# Patient Record
Sex: Female | Born: 1960 | Race: Black or African American | Hispanic: No | Marital: Married | Smoking: Never smoker
Health system: Southern US, Community
[De-identification: ages and names within clinical notes are randomized; demographics above are authoritative.]

## PROBLEM LIST (undated history)

## (undated) DIAGNOSIS — K219 Gastro-esophageal reflux disease without esophagitis: Secondary | ICD-10-CM

## (undated) DIAGNOSIS — I10 Essential (primary) hypertension: Secondary | ICD-10-CM

## (undated) DIAGNOSIS — K589 Irritable bowel syndrome without diarrhea: Secondary | ICD-10-CM

## (undated) HISTORY — PX: CHOLECYSTECTOMY: SHX55

## (undated) HISTORY — DX: Gastro-esophageal reflux disease without esophagitis: K21.9

## (undated) HISTORY — PX: PARTIAL HYSTERECTOMY: SHX80

## (undated) HISTORY — PX: ABDOMINAL HYSTERECTOMY: SHX81

## (undated) HISTORY — DX: Irritable bowel syndrome without diarrhea: K58.9

## (undated) HISTORY — PX: ROTATOR CUFF REPAIR: SHX139

## (undated) HISTORY — DX: Essential (primary) hypertension: I10

---

## 2002-02-05 ENCOUNTER — Other Ambulatory Visit: Admission: RE | Admit: 2002-02-05 | Discharge: 2002-02-05 | Payer: Self-pay | Admitting: Obstetrics and Gynecology

## 2002-08-31 ENCOUNTER — Inpatient Hospital Stay (HOSPITAL_COMMUNITY): Admission: RE | Admit: 2002-08-31 | Discharge: 2002-09-02 | Payer: Self-pay | Admitting: Obstetrics and Gynecology

## 2003-04-01 ENCOUNTER — Other Ambulatory Visit: Admission: RE | Admit: 2003-04-01 | Discharge: 2003-04-01 | Payer: Self-pay | Admitting: Obstetrics and Gynecology

## 2004-05-26 ENCOUNTER — Other Ambulatory Visit: Admission: RE | Admit: 2004-05-26 | Discharge: 2004-05-26 | Payer: Self-pay | Admitting: Obstetrics and Gynecology

## 2005-05-31 ENCOUNTER — Other Ambulatory Visit: Admission: RE | Admit: 2005-05-31 | Discharge: 2005-05-31 | Payer: Self-pay | Admitting: Obstetrics and Gynecology

## 2010-11-13 ENCOUNTER — Other Ambulatory Visit: Payer: Self-pay | Admitting: Obstetrics and Gynecology

## 2010-11-13 DIAGNOSIS — R928 Other abnormal and inconclusive findings on diagnostic imaging of breast: Secondary | ICD-10-CM

## 2010-11-20 ENCOUNTER — Ambulatory Visit
Admission: RE | Admit: 2010-11-20 | Discharge: 2010-11-20 | Disposition: A | Discharge status: Home / Self Care | Payer: Self-pay | Source: Ambulatory Visit | Attending: Obstetrics and Gynecology | Admitting: Obstetrics and Gynecology

## 2010-11-20 DIAGNOSIS — R928 Other abnormal and inconclusive findings on diagnostic imaging of breast: Secondary | ICD-10-CM

## 2012-11-19 ENCOUNTER — Other Ambulatory Visit: Payer: Self-pay | Admitting: Obstetrics and Gynecology

## 2012-11-19 DIAGNOSIS — R928 Other abnormal and inconclusive findings on diagnostic imaging of breast: Secondary | ICD-10-CM

## 2012-12-03 ENCOUNTER — Ambulatory Visit
Admission: RE | Admit: 2012-12-03 | Discharge: 2012-12-03 | Disposition: A | Discharge status: Home / Self Care | Payer: BC Managed Care – PPO | Source: Ambulatory Visit | Attending: Obstetrics and Gynecology | Admitting: Obstetrics and Gynecology

## 2012-12-03 DIAGNOSIS — R928 Other abnormal and inconclusive findings on diagnostic imaging of breast: Secondary | ICD-10-CM

## 2015-01-12 ENCOUNTER — Other Ambulatory Visit: Payer: Self-pay | Admitting: Obstetrics and Gynecology

## 2015-01-13 LAB — CYTOLOGY - PAP

## 2017-01-30 ENCOUNTER — Other Ambulatory Visit: Payer: Self-pay | Admitting: Obstetrics and Gynecology

## 2017-01-30 DIAGNOSIS — R928 Other abnormal and inconclusive findings on diagnostic imaging of breast: Secondary | ICD-10-CM

## 2017-02-04 ENCOUNTER — Ambulatory Visit
Admission: RE | Admit: 2017-02-04 | Discharge: 2017-02-04 | Disposition: A | Discharge status: Home / Self Care | Payer: BLUE CROSS/BLUE SHIELD | Source: Ambulatory Visit | Attending: Obstetrics and Gynecology | Admitting: Obstetrics and Gynecology

## 2017-02-04 DIAGNOSIS — R928 Other abnormal and inconclusive findings on diagnostic imaging of breast: Secondary | ICD-10-CM

## 2017-08-02 IMAGING — MG 2D DIGITAL DIAGNOSTIC UNILATERAL LEFT MAMMOGRAM WITH CAD AND ADJ
6 series · 6 of 14 positions shown · non-contrast
Comparison: Previous exam(s).

CLINICAL DATA: Screening recall for a possible asymmetry in the
left breast seen on the CC view only.

EXAM:
2D DIGITAL DIAGNOSTIC UNILATERAL LEFT MAMMOGRAM WITH CAD AND ADJUNCT
TOMO

[L MLO]
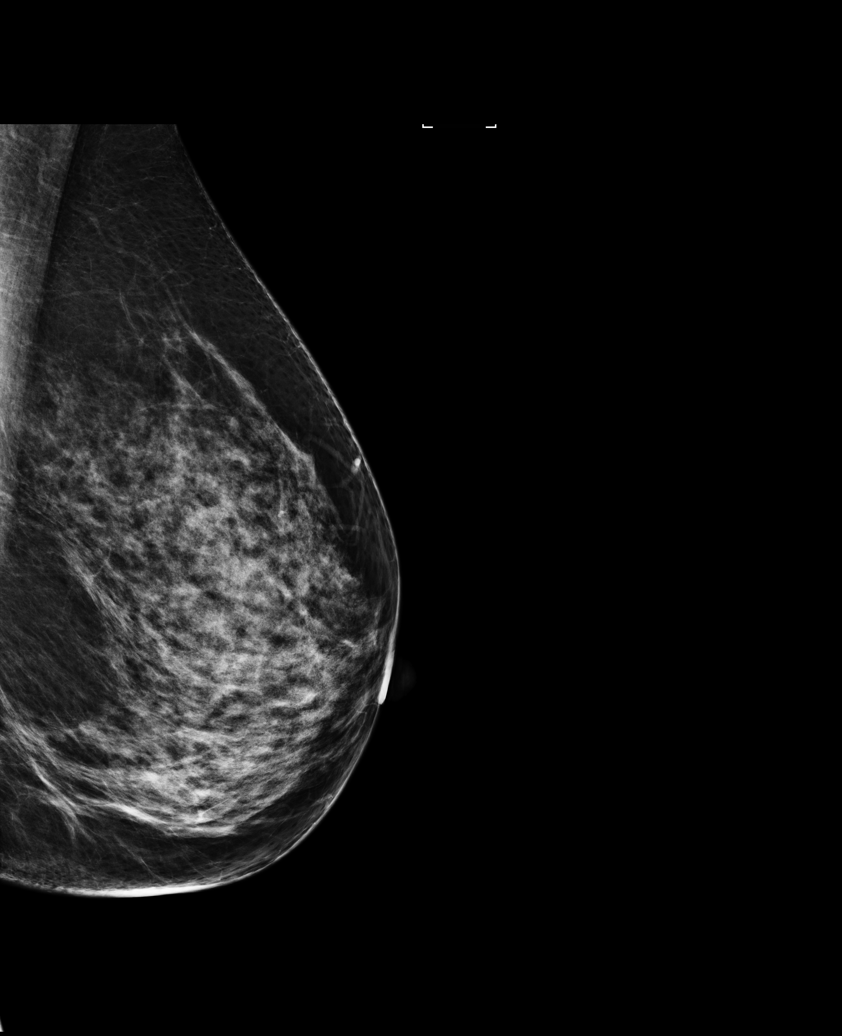

[L CC]
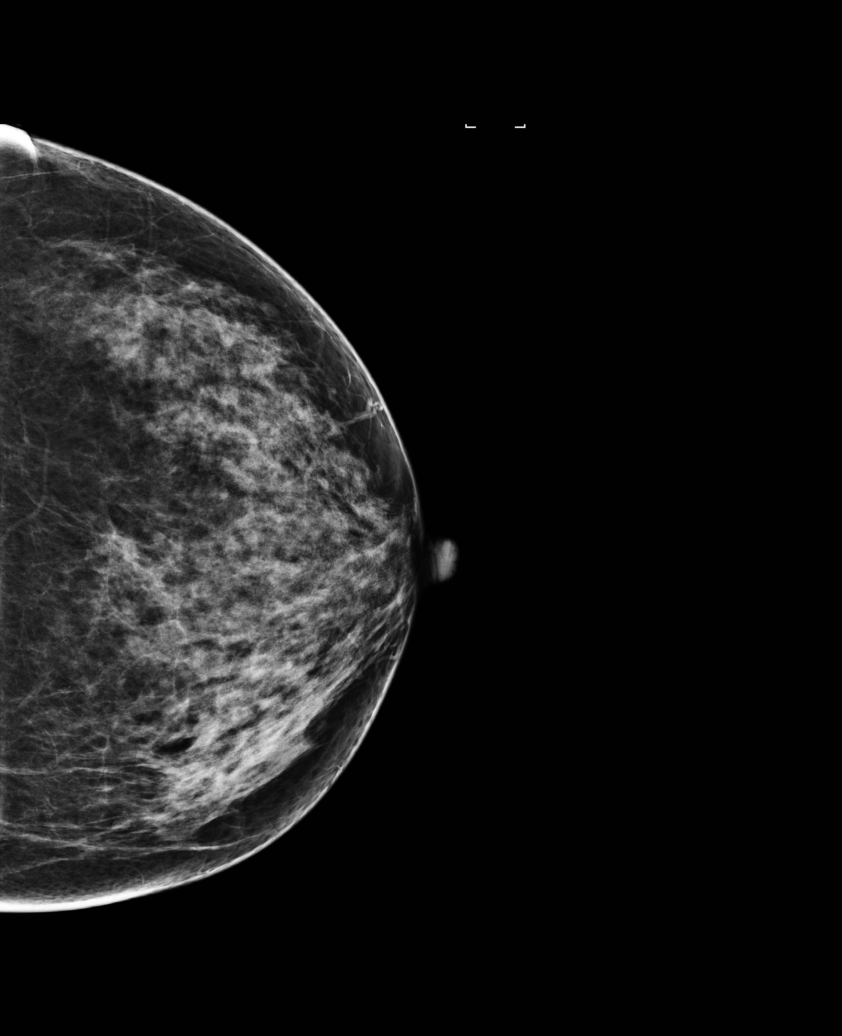

[L MLO synth-2D]
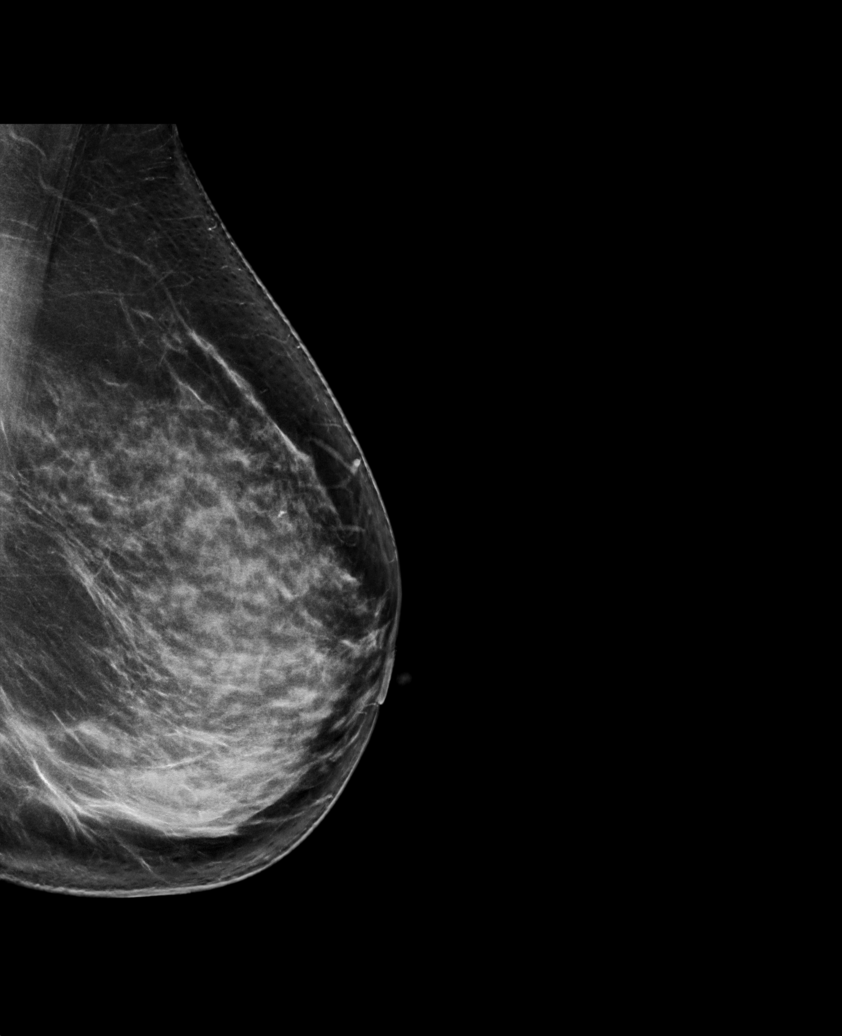

[L CC synth-2D]
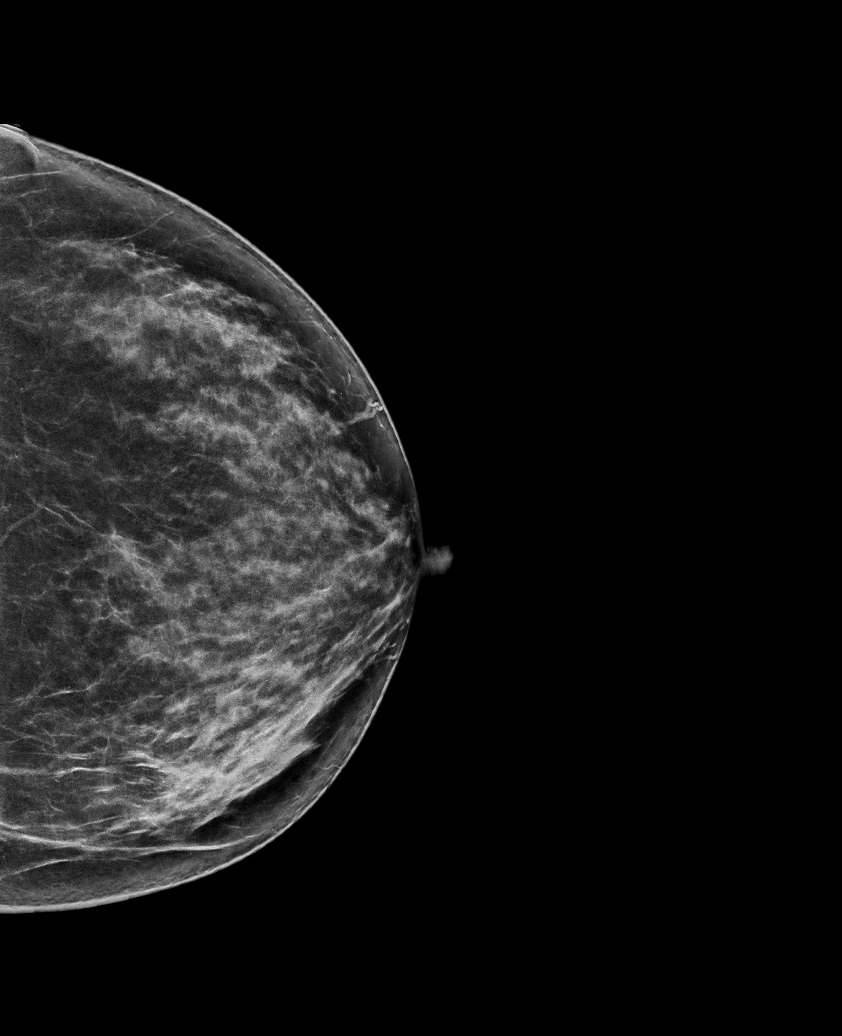

[L MLO tomo · tomo slice 43/84.0]
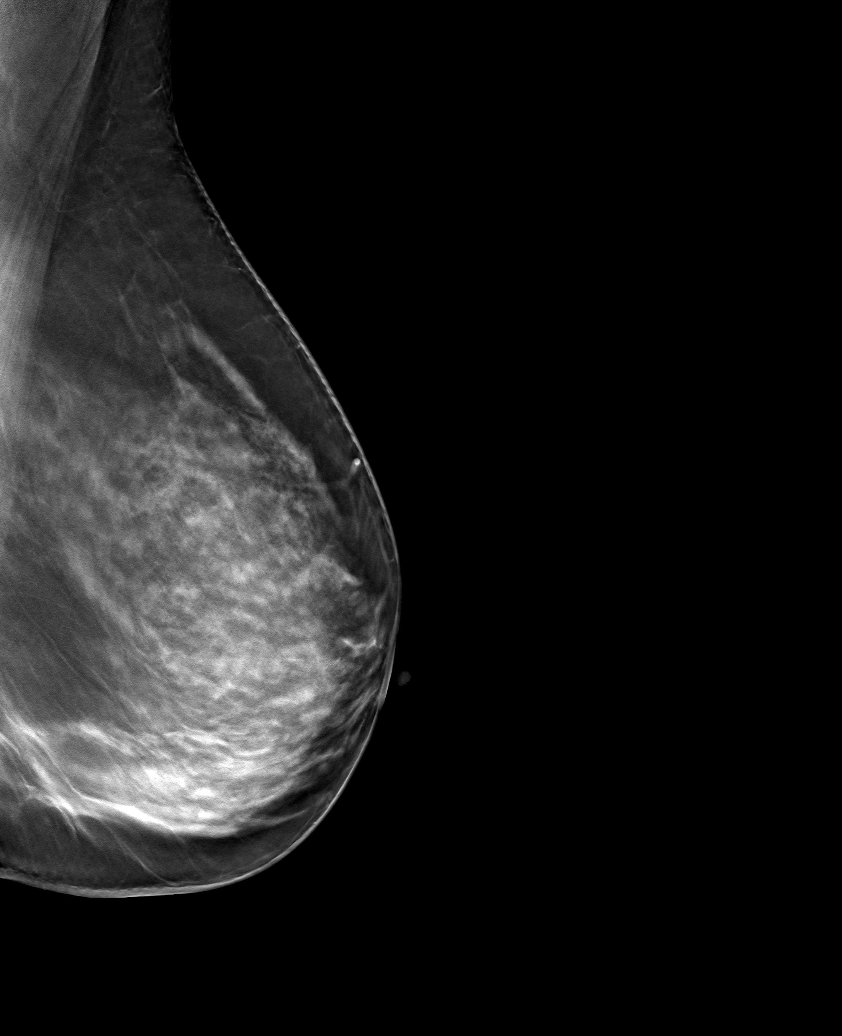

[L CC tomo · tomo slice 37/74.0]
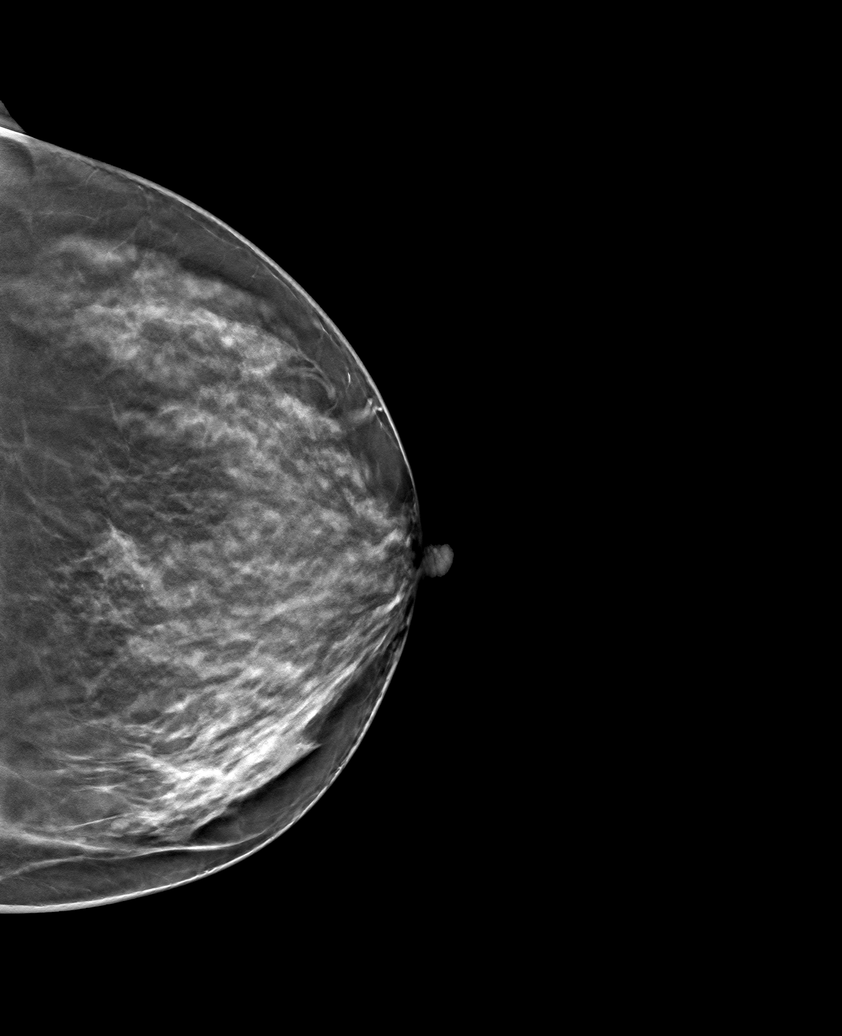

[6 of 14 positions shown; findings below may reference images not displayed]

ACR Breast Density Category c: The breast tissue is heterogeneously
dense, which may obscure small masses.
FINDINGS: On the diagnostic 2D and 3D images, the possible asymmetry noted
laterally on the screening CC view is not reproduced. There are no
areas of significant asymmetry. There are no discrete masses or
areas of architectural distortion. There are no suspicious
calcifications.

Mammographic images were processed with CAD.
IMPRESSION: 1. No evidence of malignancy. The possible asymmetry noted on the
screening study was due to superimposed fibroglandular tissue.

RECOMMENDATION:
Screening mammogram in one year.(Code:QS-I-NT2)

I have discussed the findings and recommendations with the patient.
Results were also provided in writing at the conclusion of the
visit. If applicable, a reminder letter will be sent to the patient
regarding the next appointment.

BI-RADS CATEGORY  1: Negative.

## 2021-02-23 ENCOUNTER — Encounter: Payer: Self-pay | Admitting: Nurse Practitioner

## 2021-03-24 ENCOUNTER — Ambulatory Visit (INDEPENDENT_AMBULATORY_CARE_PROVIDER_SITE_OTHER): Payer: BC Managed Care – PPO | Admitting: Nurse Practitioner

## 2021-03-24 ENCOUNTER — Other Ambulatory Visit: Payer: Self-pay

## 2021-03-24 ENCOUNTER — Encounter: Payer: Self-pay | Admitting: Nurse Practitioner

## 2021-03-24 VITALS — BP 120/80 | HR 93 | Ht 63.0 in | Wt 155.0 lb

## 2021-03-24 DIAGNOSIS — R1032 Left lower quadrant pain: Secondary | ICD-10-CM | POA: Diagnosis not present

## 2021-03-24 DIAGNOSIS — K5909 Other constipation: Secondary | ICD-10-CM | POA: Diagnosis not present

## 2021-03-24 DIAGNOSIS — K219 Gastro-esophageal reflux disease without esophagitis: Secondary | ICD-10-CM | POA: Diagnosis not present

## 2021-03-24 MED ORDER — DICYCLOMINE HCL 10 MG PO CAPS: 10.0000 mg | ORAL_CAPSULE | Freq: Two times a day (BID) | ORAL | 0 refills | Status: DC | PRN

## 2021-03-24 NOTE — Patient Instructions (Addendum)
If you are age 61 or younger, your body mass index should be between 19-25. Your Body mass index is 27.46 kg/m. If this is out of the aformentioned range listed, please consider follow up with your Primary Care Provider.   __________________________________________________________  The  GI providers would like to encourage you to use [facility] to communicate with providers for non-urgent requests or questions.  Due to long hold times on the telephone, sending your provider a message by [facility] may be a faster and more efficient way to get a response.  Please allow 48 business hours for a response.  Please remember that this is for non-urgent requests.   RESUME: Motegrity  We have sent the following medications to your pharmacy for you to pick up at your convenience:  START: Bentyl 10mg  one tablet twice daily as needed for stomach cramping.  We will attempt to obtain records from Dr .  You are scheduled to follow up with our office on 04-26-2021 at 11:30am.  Thank you for entrusting me with your care and choosing [facility].  [facility], NP

## 2021-03-24 NOTE — Progress Notes (Signed)
ASSESSMENT AND PLAN    # 60 year old female who is self-referred for evaluation of crampy intermittent LLQ pain over the last 2 years.  Apparently GYN evaluations have been negative as has a colonoscopy in [facility] . --Request records from [facility] including colonoscopy and upper endoscopies. --Patient will return to see me in approximately 3 weeks, I should have the records by then.  In the interim I would like to make sure that constipation is not a factor in her pain as she does give a history of chronic constipation.  I asked her to resume daily Motegrity (she has some at home) --For abdominal cramping she will try Bentyl 10 mg twice daily.  Hopefully Motegrity will prevent her from getting constipated on Bentyl  # GERD, ? History of esophageal strictures. Currently asymptomatic on daily Dexilant --Await EGD reports  HISTORY OF PRESENT ILLNESS     Chief Complaint : LLQ pain  Valerie Nunez is a 60 y.o. female with a past medical history significant for IBS, hypertension, asthma, arthritis, GERD and cholecystectomy.   Patient is new to the practice, self-referred for evaluation of abdominal pain.  She has been to the ED twice since June for evaluation of LLQ pain.  She has an appointment to establish care with Dr. Karilyn Cota but it isn't until November and she wanted to be seen sooner.  Patient gives a history of intermittent LLQ pain for 2 years.  She reports a negative work-up by gynecology.  She was seen in the ED for LLQ pain 02/11/2021.  UA was normal.  Her CBC, CMP were normal.  Lipase minimally elevated at 64.  No imaging was done in the ED  Patient says that she has had an EGD and colonoscopy in [place] within the last couple of years.  She is frustrated that there has been no etiology found for her ongoing LLQ pain.  She describes this pain as being crampy in nature.  She has episodes about 2-3 times a week and sometimes the pain lasts all day.  She  cannot relate the pain to eating and it does not improve with bowel movements.  She has a history of chronic constipation.  In the past she has taken Linzess and Motegrity but stopped both of them because she thought it exacerbated her LLQ pain.  She has been trying to manage her constipation with diet and does feel like things have improved  Yuridia gives a history of GERD.  She describes 2 esophageal dilations by a surgeon in [place] within the last few years.  She takes Dexilant.  Currently no GERD symptoms nor dysphagia    Past Medical History:  Diagnosis Date   GERD (gastroesophageal reflux disease)    Hypertension    IBS (irritable bowel syndrome)      Past Surgical History:  Procedure Laterality Date   CHOLECYSTECTOMY     PARTIAL HYSTERECTOMY     ROTATOR CUFF REPAIR     History reviewed. No pertinent family history.   Current Outpatient Medications  Medication Sig Dispense Refill   amLODipine (NORVASC) 5 MG tablet Take 5 mg by mouth daily.     dexlansoprazole (DEXILANT) 60 MG capsule Dexilant 60 mg capsule, delayed release     dicyclomine (BENTYL) 10 MG capsule Take 1 capsule (10 mg total) by mouth 2 (two) times daily as needed for spasms. 60 capsule 0   No current facility-administered medications for this visit.   No Known Allergies   Review  of Systems: Positive for allergy, sinus trouble, anxiety, arthritis, occasional back pain, muscle pain and cramps, sore throat and voice changes.  All other systems reviewed and negative except where noted in HPI.    PHYSICAL EXAM :    Wt Readings from Last 3 Encounters:  No data found for Wt    BP 120/80   Pulse 93   Ht 5\' 3"  (1.6 m)   Wt 155 lb (70.3 kg)   BMI 27.46 kg/m  Constitutional:  Pleasant female in no acute distress. Psychiatric: Normal mood and affect. Behavior is normal. EENT: Pupils normal.  Conjunctivae are normal. No scleral icterus. Neck supple.  Cardiovascular: Normal rate, regular rhythm. No  edema Pulmonary/chest: Effort normal and breath sounds normal. No wheezing, rales or rhonchi. Abdominal: Soft, nondistended, nontender. Bowel sounds active throughout. There are no masses palpable. No hepatomegaly. Neurological: Alert and oriented to person place and time. Skin: Skin is warm and dry. No rashes noted.  , NP  03/24/2021, 2:11 PM

## 2021-03-27 NOTE — Progress Notes (Signed)
____________________________________________________________  Attending physician addendum:  Thank you for sending this case to me. I have reviewed the entire note and agree with the plan.  When records available for review, please be sure to discover if  CT scan of the abdomen has been performed as part of this workup.  Amada Jupiter, MD  ____________________________________________________________

## 2021-04-17 ENCOUNTER — Other Ambulatory Visit: Payer: Self-pay | Admitting: Nurse Practitioner

## 2021-04-26 ENCOUNTER — Other Ambulatory Visit (INDEPENDENT_AMBULATORY_CARE_PROVIDER_SITE_OTHER): Payer: BC Managed Care – PPO

## 2021-04-26 ENCOUNTER — Encounter: Payer: Self-pay | Admitting: Nurse Practitioner

## 2021-04-26 ENCOUNTER — Ambulatory Visit (INDEPENDENT_AMBULATORY_CARE_PROVIDER_SITE_OTHER): Payer: BC Managed Care – PPO | Admitting: Nurse Practitioner

## 2021-04-26 ENCOUNTER — Telehealth: Payer: Self-pay | Admitting: Nurse Practitioner

## 2021-04-26 VITALS — BP 110/78 | HR 80 | Ht 63.0 in | Wt 159.0 lb

## 2021-04-26 DIAGNOSIS — D649 Anemia, unspecified: Secondary | ICD-10-CM | POA: Diagnosis not present

## 2021-04-26 DIAGNOSIS — R109 Unspecified abdominal pain: Secondary | ICD-10-CM

## 2021-04-26 DIAGNOSIS — D508 Other iron deficiency anemias: Secondary | ICD-10-CM | POA: Diagnosis not present

## 2021-04-26 DIAGNOSIS — K219 Gastro-esophageal reflux disease without esophagitis: Secondary | ICD-10-CM

## 2021-04-26 LAB — CBC
HCT: 39.8 % (ref 36.0–46.0)
Hemoglobin: 13.3 g/dL (ref 12.0–15.0)
MCHC: 33.3 g/dL (ref 30.0–36.0)
MCV: 100.3 fl — ABNORMAL HIGH (ref 78.0–100.0)
Platelets: 245 10*3/uL (ref 150.0–400.0)
RBC: 3.97 Mil/uL (ref 3.87–5.11)
RDW: 12.9 % (ref 11.5–15.5)
WBC: 5 10*3/uL (ref 4.0–10.5)

## 2021-04-26 NOTE — Telephone Encounter (Signed)
I am so sorry. I do not understand the message. Does she need a call back? Or is she sharing information?

## 2021-04-26 NOTE — Progress Notes (Signed)
ASSESSMENT AND PLAN     # 60 yo female with chronic intermittent left mid / LLQ pain unrelated to physical activity or bowel movements. Pain possibly musculoskeletal . Pain could also be secondary to constipation as she feels better having regular bowel movements on high fiber diet and Motegrity. She reports a negative pelvic US by her GYN. Records from GI in [place] were reviewed today. No findings on colonoscopy in March 2021   --Since patient is doing better will continue current bowel regimen and hold off on further evaluation.  She was advised to call our office for recurrent left-sided abdominal pain at which time we may proceed with a CTAP.   # Longstanding GERD without Barrett's esophagus. EGD in March 2021 in [place] suggested mild esophagitis ( ? Reflux related).  She has apparently been on numerous different PPIs with Dexilant being the only medication that controls her symptoms.   # ? Anemia. Patient describes low RBCs on labs drawn 04/17/21.  In the past patient  has periodically taken iron ( on her own) because she thought she may be anemic due to being cold all the time. CBC Oct 2021 and June 2022 at [facility] were normal.  --check CBC today  HISTORY OF PRESENT ILLNESS    Chief Complaint : follow up on left sided abdominal pain  Valerie Nunez is a 60 y.o. female known to Dr. Myrtie Neither  with a past medical history significant for IBS, hypertension, asthma, arthritis, GERD and cholecystectomy in 2014, hysterectomy ( ? Complete). See PMH below for any additional medical problems.    03/24/2021 - established care.  She was self-referred for evaluation of LLQ pain.  Patient had been seen at outside emergency departments, local ED 02/11/21 as well as GYN for evaluation of LLQ pain. She had an EGD and colonoscopy in [place] within the last 2 years.  No records were available at the time of patient's visit.  There was a question of whether constipation could be contributing to the LLQ pain  as she had discontinued both Linzess and Motegrity .  Plan was to resume Motegrity, obtain records and bring patient back for follow-up   Review of data:  Records from [place]  12/08/2015 EGD for dysphagia [facility].  No abnormalities noted  10/20/2019 EGD and colonoscopy [facility] Dr. Trinidad Curet EGD done for GERD and dysphagia.  Findings included localized erythema of the mucosa it the GE junction compatible with esophagitis.  Colonoscopy -  bowel prep was good.  Exam complete.  Exam was normal.  Follow-up colonoscopy recommended in 10 years.  November 2021 EGD for dysphagia.  A reducible sliding hiatal hernia was seen.  Large amount of retained food in the stomach.  07/25/2020 Gastric emptying study -[facility]  Borderline normal gastric emptying .  Gastric emptying was 45% at 120 minutes with normal empty being equal or greater than 40% and 120 minutes  02/11/21 Local ED visit for left sided abdominal pain  UA was normal.  Her CBC, CMP were normal.  Lipase minimally elevated at 64.  No imaging was done in the ED   INTERVAL HISTORY:  Valerie Nunez hasn't had very many episodes of LLQ / left mid abdominal pain since our last visit.  When she does get the discomfort it is not related to physical activity though sometimes when bending over she gets suprabubic pain. The left sided abdominal pain is not relieved with defecation.   Bentyl helps but  not needing it very often.  She has been eating a lot of vegetables and taking Motegrity and having a BM nearly every day.  She cannot take a fiber supplement with Motegrity as the combination causes abdominal discomfort.  She has no blood in stool. Her weight is stable.   Diany gives a history of GERD. EGD in March 2021 suggested mild esophagitis.  She has apparently been on numerous different PPIs with Dexilant being the only medication that controls her symptoms.   Patient says her RBCs were low on labs from PCP 04/17/21.  She frequently has cold hands and feet and has periodically taken iron because she thought she might be anemic. As much as she can remember a health care provider has never prescribed iron or mentioned that she was anemic.  Of note, CBC at [facility] October 2021 and in June 2022 were normal.    Past Medical History:  Diagnosis Date   GERD (gastroesophageal reflux disease)    Hypertension    IBS (irritable bowel syndrome)     Current Medications, Allergies, Past Surgical History, Family History and Social History were reviewed in [employer] record.   Current Outpatient Medications  Medication Sig Dispense Refill   albuterol (VENTOLIN HFA) 108 (90 Base) MCG/ACT inhaler Inhale 2 puffs into the lungs every 6 (six) hours as needed for wheezing or shortness of breath.     amLODipine (NORVASC) 5 MG tablet Take 5 mg by mouth daily.     dexlansoprazole (DEXILANT) 60 MG capsule Dexilant 60 mg capsule, delayed release     dicyclomine (BENTYL) 10 MG capsule TAKE 1 CAPSULE (10 MG TOTAL) BY MOUTH 2 (TWO) TIMES DAILY AS NEEDED FOR SPASMS. 60 capsule 0   estradiol (ESTRACE) 1 MG tablet Take 1 tablet by mouth daily.     fexofenadine (ALLEGRA) 180 MG tablet Take 180 mg by mouth daily as needed for allergies or rhinitis.     potassium chloride 20 MEQ/15ML (10%) SOLN Take 15 mLs by mouth daily.     Prucalopride Succinate (MOTEGRITY) 1 MG TABS Take 1 tablet by mouth daily.     No current facility-administered medications for this visit.    Review of Systems: No chest pain. No shortness of breath. No urinary complaints.   PHYSICAL EXAM :    Wt Readings from Last 3 Encounters:  03/24/21 155 lb (70.3 kg)    BP 110/78   Pulse 80   Ht 5\' 3"  (1.6 m)   Wt 159 lb (72.1 kg)   SpO2 98%   BMI 28.17 kg/m  Constitutional:  Pleasant female in no acute distress. Psychiatric: Normal mood and affect. Behavior is normal. EENT: Pupils normal.  Conjunctivae are normal. No scleral icterus. Neck  supple.  Cardiovascular: Normal rate, regular rhythm. No edema Pulmonary/chest: Effort normal and breath sounds normal. No wheezing, rales or rhonchi. Abdominal: Soft, nondistended, nontender. Bowel sounds active throughout. There are no masses palpable. No hepatomegaly. Neurological: Alert and oriented to person place and time. Skin: Skin is warm and dry. No rashes noted.  , NP  04/26/2021, 11:34 AM

## 2021-04-26 NOTE — Telephone Encounter (Signed)
Based on OV note from today. She was told to call and give recent blood work to Haematologist

## 2021-04-26 NOTE — Telephone Encounter (Signed)
Valerie Nunez, thanks.  Yes she was just sharing an abnormal lab from PCP's office like I asked her to. Also, I sent you a message on her labs she had drawn today.  Thanks

## 2021-04-26 NOTE — Telephone Encounter (Signed)
Inbound call from patient following Gunnar Fusi request to give recent blood work results.  4.07 Red blood cell

## 2021-04-26 NOTE — Patient Instructions (Addendum)
It was my pleasure to provide care to you today. Based on our discussion, I am providing you with my recommendations below:  RECOMMENDATION(S):   Call my nurse if you are having recurrent symptoms of abdominal pain Please call my nurse, Beth, with results of your most recent blood work Please continue Dexilant  LABS:   Please proceed to the basement level for lab work before leaving today. Press "B" on the elevator. The lab is located at the first door on the left as you exit the elevator.  HEALTHCARE LAWS AND MY CHART RESULTS:   Due to recent changes in healthcare laws, you may see results of your imaging and/or laboratory studies on MyChart before I have had a chance to review them.  I understand that in some cases there may be results that are confusing or concerning to you. Please understand that not all results are received at same time and often I may need to interpret multiple results in order to provide you with the best plan of care or course of treatment. Therefore, I ask that you please give me 48 hours to thoroughly review all your results before contacting my office for clarification.   FOLLOW UP:  I would like for you to follow up with me or Dr. Myrtie Neither in 3 months. Please call the office at [phone] to schedule your appointment.  BMI:  If you are age 61 or younger, your body mass index should be between 19-25. Your Body mass index is 28.17 kg/m. If this is out of the aformentioned range listed, please consider follow up with your Primary Care Provider.   MY CHART:  The [place] [facility] providers would like to encourage you to use [facility] to communicate with providers for non-urgent requests or questions.  Due to long hold times on the telephone, sending your provider a message by [facility] may be a faster and more efficient way to get a response.  Please allow 48 business hours for a response.  Please remember that this is for non-urgent requests.   Thank you for trusting me  with your gastrointestinal care!    Willette Cluster, NP

## 2021-04-26 NOTE — Progress Notes (Signed)
____________________________________________________________  Attending physician addendum:  Thank you for sending this case to me. I have reviewed the entire note and agree with the plan.   Kmarion Rawl Danis, MD  ____________________________________________________________  

## 2021-04-29 ENCOUNTER — Other Ambulatory Visit: Payer: Self-pay | Admitting: Nurse Practitioner

## 2021-06-20 ENCOUNTER — Ambulatory Visit (INDEPENDENT_AMBULATORY_CARE_PROVIDER_SITE_OTHER): Payer: BLUE CROSS/BLUE SHIELD | Admitting: Internal Medicine

## 2021-08-01 ENCOUNTER — Ambulatory Visit (INDEPENDENT_AMBULATORY_CARE_PROVIDER_SITE_OTHER): Payer: BC Managed Care – PPO | Admitting: Gastroenterology

## 2021-08-01 ENCOUNTER — Encounter: Payer: Self-pay | Admitting: Gastroenterology

## 2021-08-01 VITALS — BP 108/78 | HR 80 | Ht 63.0 in | Wt 158.4 lb

## 2021-08-01 DIAGNOSIS — R1012 Left upper quadrant pain: Secondary | ICD-10-CM

## 2021-08-01 DIAGNOSIS — K219 Gastro-esophageal reflux disease without esophagitis: Secondary | ICD-10-CM

## 2021-08-01 DIAGNOSIS — K5909 Other constipation: Secondary | ICD-10-CM

## 2021-08-01 NOTE — Progress Notes (Signed)
[facility] Progress Note  Chief Complaint: Left upper quadrant pain  Subjective  History: From APP note 04/26/21: "# 60 yo female with chronic intermittent left mid / LLQ pain unrelated to physical activity or bowel movements. Pain possibly musculoskeletal . Pain could also be secondary to constipation as she feels better having regular bowel movements on high fiber diet and Motegrity. She reports a negative pelvic US by her GYN. Records from GI in [place] were reviewed today. No findings on colonoscopy in March 2021   --Since patient is doing better will continue current bowel regimen and hold off on further evaluation.  She was advised to call our office for recurrent left-sided abdominal pain at which time we may proceed with a CTAP.    # Longstanding GERD without Barrett's esophagus. EGD in March 2021 in [place] suggested mild esophagitis ( ? Reflux related).  She has apparently been on numerous different PPIs with Dexilant being the only medication that controls her symptoms. "  ___________________________  Valerie Nunez reports that the dull left upper quadrant pain comes and goes, not as bad as before.  She feels it may have something to do with constipation, since it is more present at times when the Motegrity does not seem to be working quite as well. She has an intermittent regurgitation and sometimes a feeling of lump in the throat.  Does not recall if previous endoscopic dilations in [place] improved her dysphagia.  ROS: Cardiovascular:  no chest pain Respiratory: no dyspnea  The patient's Past Medical, Family and Social History were reviewed and are on file in the EMR.  Objective:  Med list reviewed  Current Outpatient Medications:    albuterol (PROVENTIL HFA) 108 (90 Base) MCG/ACT inhaler, Inhale 1-2 puffs into the lungs every 6 (six) hours as needed for wheezing or shortness of breath., Disp: , Rfl:    amLODipine (NORVASC) 5 MG tablet, Take 5 mg by mouth daily.,  Disp: , Rfl:    dexlansoprazole (DEXILANT) 60 MG capsule, Dexilant 60 mg capsule, delayed release, Disp: , Rfl:    dicyclomine (BENTYL) 10 MG capsule, TAKE 1 CAPSULE (10 MG TOTAL) BY MOUTH 2 (TWO) TIMES DAILY AS NEEDED FOR SPASMS., Disp: 180 capsule, Rfl: 1   fexofenadine (ALLEGRA) 180 MG tablet, Take 180 mg by mouth daily as needed for allergies or rhinitis., Disp: , Rfl:    fluticasone furoate-vilanterol (BREO ELLIPTA) 200-25 MCG/ACT AEPB, Inhale 1 puff into the lungs daily., Disp: , Rfl:    hydrOXYzine (ATARAX) 25 MG tablet, Take 25 mg by mouth every 6 (six) hours as needed., Disp: , Rfl:    potassium chloride 20 MEQ/15ML (10%) SOLN, Take 15 mLs by mouth daily., Disp: , Rfl:    Prucalopride Succinate (MOTEGRITY) 1 MG TABS, Take 1 tablet by mouth daily., Disp: , Rfl:    Vital signs in last 24 hrs: Vitals:   08/01/21 1503  BP: 108/78  Pulse: 80  SpO2: 99%   Wt Readings from Last 3 Encounters:  08/01/21 158 lb 6 oz (71.8 kg)  04/26/21 159 lb (72.1 kg)  03/24/21 155 lb (70.3 kg)    Physical Exam  Well-appearing HEENT: sclera anicteric, oral mucosa moist without lesions Neck: supple, no thyromegaly, JVD or lymphadenopathy Cardiac: RRR without murmurs, S1S2 heard, no peripheral edema Pulm: clear to auscultation bilaterally, normal RR and effort noted Abdomen: soft, no tenderness, with active bowel sounds. No guarding or palpable hepatosplenomegaly. Skin; warm and dry, no jaundice or rash  Labs:  ___________________________________________ Radiologic studies:   ____________________________________________ Other:   _____________________________________________ Assessment & Plan  Assessment: Encounter Diagnoses  Name Primary?   LUQ pain Yes   Chronic constipation    Gastroesophageal reflux disease, unspecified whether esophagitis present    Chronic LUQ pain, nonspecific, negative extensive work-up with GYN as well as CT imaging and EGD colonoscopy in [place]. It  does sound likely related constipation.  I reassured her and have no further testing planned at present. Longstanding reflux, she says [name].  Sounds like she has some globus related to it but not likely stricture requiring endoscopy at this time  Return as needed  20 minutes were spent on this encounter (including chart review, history/exam, counseling/coordination of care, and documentation) > 50% of that time was spent on counseling and coordination of care.   Charlie Pitter III

## 2021-08-01 NOTE — Patient Instructions (Signed)
If you are age 61 or older, your body mass index should be between 23-30. Your Body mass index is 28.05 kg/m. If this is out of the aforementioned range listed, please consider follow up with your Primary Care Provider.  If you are age 92 or younger, your body mass index should be between 19-25. Your Body mass index is 28.05 kg/m. If this is out of the aformentioned range listed, please consider follow up with your Primary Care Provider.   ________________________________________________________  The [place] [facility] providers would like to encourage you to use [facility] to communicate with providers for non-urgent requests or questions.  Due to long hold times on the telephone, sending your provider a message by [facility] may be a faster and more efficient way to get a response.  Please allow 48 business hours for a response.  Please remember that this is for non-urgent requests.  _______________________________________________________  It was a pleasure to see you today!  Thank you for trusting me with your gastrointestinal care!

## 2022-02-03 ENCOUNTER — Encounter (HOSPITAL_COMMUNITY): Payer: Self-pay

## 2022-02-03 ENCOUNTER — Emergency Department (HOSPITAL_COMMUNITY)
Admission: EM | Admit: 2022-02-03 | Discharge: 2022-02-03 | Disposition: A | Discharge status: Home / Self Care | Payer: BC Managed Care – PPO | Attending: Emergency Medicine | Admitting: Emergency Medicine

## 2022-02-03 ENCOUNTER — Emergency Department (HOSPITAL_COMMUNITY): Payer: BC Managed Care – PPO

## 2022-02-03 ENCOUNTER — Other Ambulatory Visit: Payer: Self-pay

## 2022-02-03 DIAGNOSIS — Z79899 Other long term (current) drug therapy: Secondary | ICD-10-CM | POA: Insufficient documentation

## 2022-02-03 DIAGNOSIS — I10 Essential (primary) hypertension: Secondary | ICD-10-CM | POA: Insufficient documentation

## 2022-02-03 DIAGNOSIS — R1032 Left lower quadrant pain: Secondary | ICD-10-CM | POA: Diagnosis not present

## 2022-02-03 DIAGNOSIS — R1012 Left upper quadrant pain: Secondary | ICD-10-CM | POA: Insufficient documentation

## 2022-02-03 DIAGNOSIS — R109 Unspecified abdominal pain: Secondary | ICD-10-CM

## 2022-02-03 LAB — URINALYSIS, ROUTINE W REFLEX MICROSCOPIC
Bilirubin Urine: NEGATIVE
Glucose, UA: NEGATIVE mg/dL
Hgb urine dipstick: NEGATIVE
Ketones, ur: NEGATIVE mg/dL
Leukocytes,Ua: NEGATIVE
Nitrite: NEGATIVE
Protein, ur: NEGATIVE mg/dL
Specific Gravity, Urine: 1.004 — ABNORMAL LOW (ref 1.005–1.030)
pH: 7 (ref 5.0–8.0)

## 2022-02-03 LAB — COMPREHENSIVE METABOLIC PANEL
ALT: 12 U/L (ref 0–44)
AST: 18 U/L (ref 15–41)
Albumin: 3.9 g/dL (ref 3.5–5.0)
Alkaline Phosphatase: 72 U/L (ref 38–126)
Anion gap: 10 (ref 5–15)
BUN: 9 mg/dL (ref 8–23)
CO2: 26 mmol/L (ref 22–32)
Calcium: 9.3 mg/dL (ref 8.9–10.3)
Chloride: 106 mmol/L (ref 98–111)
Creatinine, Ser: 1.14 mg/dL — ABNORMAL HIGH (ref 0.44–1.00)
GFR, Estimated: 55 mL/min — ABNORMAL LOW (ref >60)
Glucose, Bld: 121 mg/dL — ABNORMAL HIGH (ref 70–99)
Potassium: 3.6 mmol/L (ref 3.5–5.1)
Sodium: 142 mmol/L (ref 135–145)
Total Bilirubin: 0.4 mg/dL (ref 0.3–1.2)
Total Protein: 7.1 g/dL (ref 6.5–8.1)

## 2022-02-03 LAB — CBC
HCT: 37.9 % (ref 36.0–46.0)
Hemoglobin: 12.9 g/dL (ref 12.0–15.0)
MCH: 33.9 pg (ref 26.0–34.0)
MCHC: 34 g/dL (ref 30.0–36.0)
MCV: 99.5 fL (ref 80.0–100.0)
Platelets: 207 10*3/uL (ref 150–400)
RBC: 3.81 MIL/uL — ABNORMAL LOW (ref 3.87–5.11)
RDW: 13.1 % (ref 11.5–15.5)
WBC: 6 10*3/uL (ref 4.0–10.5)
nRBC: 0 % (ref 0.0–0.2)

## 2022-02-03 LAB — LIPASE, BLOOD: Lipase: 30 U/L (ref 11–51)

## 2022-02-03 MED ORDER — ONDANSETRON 4 MG PO TBDP: 4.0000 mg | ORAL_TABLET | Freq: Three times a day (TID) | ORAL | 0 refills | Status: AC | PRN

## 2022-02-03 MED ORDER — MORPHINE SULFATE (PF) 4 MG/ML IV SOLN
4.0000 mg | Freq: Once | INTRAVENOUS | Status: AC
  Administered 2022-02-03: 4 mg via INTRAVENOUS
  Filled 2022-02-03: qty 1

## 2022-02-03 MED ORDER — IOHEXOL 300 MG/ML  SOLN
100.0000 mL | Freq: Once | INTRAMUSCULAR | Status: AC | PRN
  Administered 2022-02-03: 100 mL via INTRAVENOUS

## 2022-02-03 MED ORDER — ONDANSETRON HCL 4 MG/2ML IJ SOLN
4.0000 mg | Freq: Once | INTRAMUSCULAR | Status: AC
  Administered 2022-02-03: 4 mg via INTRAVENOUS
  Filled 2022-02-03: qty 2

## 2022-02-03 NOTE — ED Provider Notes (Signed)
[facility] EMERGENCY DEPARTMENT Provider Note   CSN: [id number] Arrival date & time: 02/03/22  0263     History  No chief complaint on file.   Valerie Nunez is a 61 y.o. female with medical history of chronic constipation, GERD, hypertension.  Presents emerged department with a chief complaint of left-sided abdominal pain.  Patient reports that pain started last week.  Pain was originally intermittent however over the last 24 hours has become constant.  Pain is rated 8/10 on the pain scale.  Patient reports pain is worse with touch.  Patient states that pain improves when she eats sometimes.  Patient reports that she has had similar episodes of pain over the past few years.  Patient does follow her [facility] and states that she is scheduled for a EGD upcoming.  Patient endorses nausea and urinary frequency.  Patient denies any fever, chills, vomiting, constipation, diarrhea, blood in stool, melena, dysuria, hematuria, urinary urgency, vaginal pain, vaginal bleeding, vaginal discharge, lightheadedness, syncope.  Denies any illicit drug use or alcohol use.  Patient has not had a menstrual period in over a year.  HPI     Home Medications Prior to Admission medications   Medication Sig Start Date End Date Taking? Authorizing Provider  albuterol (PROVENTIL HFA) 108 (90 Base) MCG/ACT inhaler Inhale 1-2 puffs into the lungs every 6 (six) hours as needed for wheezing or shortness of breath.    [provider]  amLODipine (NORVASC) 5 MG tablet Take 5 mg by mouth daily. 03/03/21   [provider]  dexlansoprazole (DEXILANT) 60 MG capsule Dexilant 60 mg capsule, delayed release 08/26/19   [provider]  dicyclomine (BENTYL) 10 MG capsule TAKE 1 CAPSULE (10 MG TOTAL) BY MOUTH 2 (TWO) TIMES DAILY AS NEEDED FOR SPASMS. 05/01/21   Meredith Pel, NP  fexofenadine (ALLEGRA) 180 MG tablet Take 180 mg by mouth daily as needed for allergies  or rhinitis.    [provider]  fluticasone furoate-vilanterol (BREO ELLIPTA) 200-25 MCG/ACT AEPB Inhale 1 puff into the lungs daily.    [provider]  hydrOXYzine (ATARAX) 25 MG tablet Take 25 mg by mouth every 6 (six) hours as needed.    [provider]  potassium chloride 20 MEQ/15ML (10%) SOLN Take 15 mLs by mouth daily. 08/25/10   [provider]  Prucalopride Succinate (MOTEGRITY) 1 MG TABS Take 1 tablet by mouth daily.    [provider]      Allergies    Naproxen, Cetirizine, Montelukast sodium, and Hydrocodone    Review of Systems   Review of Systems  Constitutional:  Negative for chills and fever.  Respiratory:  Negative for shortness of breath.   Cardiovascular:  Negative for chest pain.  Gastrointestinal:  Positive for abdominal pain. Negative for abdominal distention, anal bleeding, blood in stool, constipation, diarrhea, nausea, rectal pain and vomiting.  Genitourinary:  Positive for flank pain and frequency. Negative for decreased urine volume, difficulty urinating, dysuria, genital sores, hematuria, urgency, vaginal bleeding, vaginal discharge and vaginal pain.  Musculoskeletal:  Negative for back pain and neck pain.  Skin:  Negative for color change and rash.  Neurological:  Negative for dizziness, syncope, light-headedness and headaches.  Psychiatric/Behavioral:  Negative for confusion.     Physical Exam Updated Vital Signs BP (!) 149/92 (BP Location: Right Arm)   Pulse 66   Temp 97.9 F (36.6 C) (Oral)   Resp 16   SpO2 100%  Physical Exam Vitals and  nursing note reviewed.  Constitutional:      General: She is not in acute distress.    Appearance: She is not ill-appearing, toxic-appearing or diaphoretic.  HENT:     Head: Normocephalic.  Eyes:     General: No scleral icterus.       Right eye: No discharge.        Left eye: No discharge.  Cardiovascular:     Rate and Rhythm: Normal rate.  Pulmonary:     Effort:  Pulmonary effort is normal.  Abdominal:     General: Abdomen is flat. A surgical scar is present. There is no distension. There are no signs of injury.     Palpations: Abdomen is soft. There is no mass or pulsatile mass.     Tenderness: There is abdominal tenderness in the left upper quadrant and left lower quadrant. There is no right CVA tenderness, left CVA tenderness, guarding or rebound.     Hernia: There is no hernia in the umbilical area or ventral area.     Comments: Well-healed laparoscopic surgical scars from previous cholecystectomy.  Tenderness to left upper quadrant, left lower quadrant, and left flank.  CVA tenderness negative.  Skin:    General: Skin is warm and dry.  Neurological:     General: No focal deficit present.     Mental Status: She is alert.  Psychiatric:        Behavior: Behavior is cooperative.     ED Results / Procedures / Treatments   Labs (all labs ordered are listed, but only abnormal results are displayed) Labs Reviewed  COMPREHENSIVE METABOLIC PANEL - Abnormal; Notable for the following components:      Result Value   Glucose, Bld 121 (*)    Creatinine, Ser 1.14 (*)    GFR, Estimated 55 (*)    All other components within normal limits  CBC - Abnormal; Notable for the following components:   RBC 3.81 (*)    All other components within normal limits  URINALYSIS, ROUTINE W REFLEX MICROSCOPIC - Abnormal; Notable for the following components:   Color, Urine STRAW (*)    Specific Gravity, Urine 1.004 (*)    All other components within normal limits  LIPASE, BLOOD    EKG None  Radiology CT ABDOMEN PELVIS W CONTRAST  Result Date: 02/03/2022 CLINICAL DATA:  Left lower quadrant abdominal pain EXAM: CT ABDOMEN AND PELVIS WITH CONTRAST TECHNIQUE: Multidetector CT imaging of the abdomen and pelvis was performed using the standard protocol following bolus administration of intravenous contrast. RADIATION DOSE REDUCTION: This exam was performed according  to the departmental dose-optimization program which includes automated exposure control, adjustment of the mA and/or kV according to patient size and/or use of iterative reconstruction technique. CONTRAST:  OMNIPAQUE IOHEXOL 300 MG/ML  SOLN COMPARISON:  CT abdomen and pelvis dated February 11, 2021 FINDINGS: Lower chest: New small solid nodule of the right lower lobe measuring 3 mm on series 5, image 14. Hepatobiliary: No focal liver abnormality is seen. Status post cholecystectomy. No biliary dilatation. Pancreas: Unremarkable. No pancreatic ductal dilatation or surrounding inflammatory changes. Spleen: Normal in size without focal abnormality. Adrenals/Urinary Tract: Bilateral adrenal glands are unremarkable. No hydronephrosis or nephrolithiasis. Bladder is unremarkable. Stomach/Bowel: Stomach is within normal limits. Appendix appears normal. No evidence of bowel wall thickening, distention, or inflammatory changes. Vascular/Lymphatic: No significant vascular findings are present. No enlarged abdominal or pelvic lymph nodes. Reproductive: No adnexal masses. Other: No abdominopelvic ascites or intraperitoneal free air. Musculoskeletal: No  acute or significant osseous findings. IMPRESSION: 1. No acute findings in the abdomen or pelvis, including no evidence of obstructive uropathy. 2. Small solid nodule of the right lower lobe measuring 3 mm. No follow-up needed if patient is low-risk.This recommendation follows the consensus statement: Guidelines for Management of Incidental Pulmonary Nodules Detected on CT Images: From the Fleischner Society 2017; Radiology 2017; 284:228-243. Electronically Signed   By: Allegra Lai M.D.   On: 02/03/2022 10:19    Procedures Procedures    Medications Ordered in ED Medications  ondansetron (ZOFRAN) injection 4 mg (4 mg Intravenous Given 02/03/22 0953)  morphine (PF) 4 MG/ML injection 4 mg (4 mg Intravenous Given 02/03/22 0953)  iohexol (OMNIPAQUE) 300 MG/ML solution  100 mL (100 mLs Intravenous Contrast Given 02/03/22 1003)    ED Course/ Medical Decision Making/ A&P                           Medical Decision Making Amount and/or Complexity of Data Reviewed Labs: ordered. Radiology: ordered.  Risk Prescription drug management.   Alert 61 year old female no acute distress, nontoxic-appearing.  Presents to the emergency department the chief complaint of left-sided abdominal pain.  Information is obtained from patient and patient's daughter at bedside.  I reviewed patient's past medical records including previous provider notes from specialists, labs, and imaging.  Patient has medical history as outlined in HPI which complicates her care.  Patient is followed by Dr. Myrtie Neither with [facility].  Differential diagnosis includes but is not limited to acute diverticulitis, ureteral/renal calculus, pyelonephritis, intra-abdominal infection.    Labs were obtained while patient was in triage.  I personally viewed interpret patient's lab results.  Pertinent findings include: -Creatinine 1.14 -CBC, lipase, and urinalysis unremarkable.  Patient ordered morphine and Zofran for pain management.  Will obtain CT imaging for further evaluation of patient's abdominal complaint.  I personally viewed and interpreted patient CT imaging.  Agree with radiology interpretation of no acute intra-abdominal/pelvic abnormality.  3 mm nodule to right lobe of lung.  Patient has improvement in pain after receiving pain medication.  Able to tolerate p.o. intake without difficulty.  On serial reexamination abdomen remains soft, nondistended, with improvement in tenderness.  Patient hemodynamically stable.  Will discharge patient at this time to follow-up with [facility] gastroenter[facility].  Patient given OTC Zofran to use as needed.  Patient will follow-up with her primary care provider to discuss surveillance imaging of nodule found on CT scan.  Based on patient's chief  complaint, I considered admission might be necessary, however after reassuring ED workup feel patient is reasonable for discharge.  Discussed results, findings, treatment and follow up. Patient advised of return precautions. Patient verbalized understanding and agreed with plan.  Portions of this note were generated with Scientist, clinical (histocompatibility and immunogenetics). Dictation errors may occur despite best attempts at proofreading.         Final Clinical Impression(s) / ED Diagnoses Final diagnoses:  Left sided abdominal pain    Rx / DC Orders ED Discharge Orders          Ordered    ondansetron (ZOFRAN-ODT) 4 MG disintegrating tablet  Every 8 hours PRN        02/03/22 1116              Haskel Schroeder, PA-C 02/03/22 1119    Arby Barrette, MD 02/16/22 1144

## 2022-02-03 NOTE — ED Notes (Signed)
Transported to CT 

## 2022-02-03 NOTE — ED Triage Notes (Signed)
Patient complains of several weeks or more of left sided abdominal pain with radiation to eft flank. Reports some urinary urgency with some.

## 2022-02-03 NOTE — Discharge Instructions (Addendum)
You came to the emergency department today to be evaluated for your abdominal pain.  Your lab work and CT scan did not show any acute abnormalities like to be causing your abdominal pain.  Please follow-up closely with [facility] for further management of your symptoms.  The CT scan of your abdomen pelvis incidentally found a small solid nodule of the right lower lobe measuring 3 mm.  Please follow-up with your primary care doctor to determine if you need to have further CT imaging for surveillance of this nodule.  Get help right away if: Your pain does not go away as soon as your health care provider told you to expect. You cannot stop vomiting. Your pain is only in areas of the abdomen, such as the right side or the left lower portion of the abdomen. Pain on the right side could be caused by appendicitis. You have bloody or black stools, or stools that look like tar. You have severe pain, cramping, or bloating in your abdomen. You have signs of dehydration, such as: Dark urine, very little urine, or no urine. Cracked lips. Dry mouth. Sunken eyes. Sleepiness. Weakness. You have trouble breathing or chest pain.

## 2022-02-03 NOTE — ED Notes (Signed)
Discharge instructions reviewed with patient. Patient verbalized understanding of instructions. Follow-up care and medications were reviewed. Patient ambulatory with steady gait. VSS upon discharge.  ?

## 2022-04-02 ENCOUNTER — Ambulatory Visit (INDEPENDENT_AMBULATORY_CARE_PROVIDER_SITE_OTHER): Payer: BC Managed Care – PPO | Admitting: Nurse Practitioner

## 2022-04-02 ENCOUNTER — Encounter: Payer: Self-pay | Admitting: Nurse Practitioner

## 2022-04-02 DIAGNOSIS — K649 Unspecified hemorrhoids: Secondary | ICD-10-CM | POA: Insufficient documentation

## 2022-04-02 MED ORDER — HYDROCORTISONE ACETATE 25 MG RE SUPP: 25.0000 mg | Freq: Every day | RECTAL | 0 refills | Status: AC

## 2022-04-02 NOTE — Patient Instructions (Addendum)
_______________________________________________________  If you are age 62 or older, your body mass index should be between 23-30. Your Body mass index is 27.81 kg/m. If this is out of the aforementioned range listed, please consider follow up with your Primary Care Provider.  If you are age 39 or younger, your body mass index should be between 19-25. Your Body mass index is 27.81 kg/m. If this is out of the aformentioned range listed, please consider follow up with your Primary Care Provider.   ________________________________________________________  The [place] [facility] providers would like to encourage you to use [facility] to communicate with providers for non-urgent requests or questions.  Due to long hold times on the telephone, sending your provider a message by [facility] may be a faster and more efficient way to get a response.  Please allow 48 business hours for a response.  Please remember that this is for non-urgent requests.  _______________________________________________________ Use Anusol suppositories at bedtime for 5 nights   Use Desitin over the counter, apply a small amount inside the anus and opening 2-3 times a day for 2 weeks.  Contact the office if sx persist.   It was a pleasure to see you today!  Thank you for trusting me with your gastrointestinal care!

## 2022-04-02 NOTE — Progress Notes (Signed)
04/02/2022 Valerie Nunez [id number] [birth year]   Chief Complaint: Hemorrhoidal itchiness  History of Present Illness: Valerie Nunez is a 61 year old female with a past medical history of hypertension, GERD and chronic constipation.  S/P cholecystectomy 05/2013.  She is followed by Dr. Myrtie Neither.  She presents today with complaints of hemorrhoidal burning pain and itchiness which started 2 weeks ago.  She used Preparation H which worsened her anorectal pain.  She denies any rectal bleeding.  She has intermittent constipation for which she takes Metamucil as needed, not on a daily basis.  At times, she does not feel emptied after passing a BM.  She underwent a colonoscopy at [facility] 10/20/2019 which resulted in a good bowel prep and the colonoscopy was normal.  A repeat colonoscopy in 10 years was recommended.  An EGD was done on the same date which showed erythema at the GE junction consistent with reflux.  A repeat EGD 06/28/2020 showed a hiatal hernia and a moderate amount of retained food matter suggestive of gastroparesis.  No GERD symptoms or upper abdominal pain at this time.     Latest Ref Rng & Units 02/03/2022    7:28 AM 04/26/2021   12:20 PM  CBC  WBC 4.0 - 10.5 K/uL 6.0  5.0   Hemoglobin 12.0 - 15.0 g/dL 14.9  70.2   Hematocrit 36.0 - 46.0 % 37.9  39.8   Platelets 150 - 400 K/uL 207  245.0        Latest Ref Rng & Units 02/03/2022    7:28 AM  CMP  Glucose 70 - 99 mg/dL 637   BUN 8 - 23 mg/dL 9   Creatinine 8.58 - 8.50 mg/dL 2.77   Sodium 412 - 878 mmol/L 142   Potassium 3.5 - 5.1 mmol/L 3.6   Chloride 98 - 111 mmol/L 106   CO2 22 - 32 mmol/L 26   Calcium 8.9 - 10.3 mg/dL 9.3   Total Protein 6.5 - 8.1 g/dL 7.1   Total Bilirubin 0.3 - 1.2 mg/dL 0.4   Alkaline Phos 38 - 126 U/L 72   AST 15 - 41 U/L 18   ALT 0 - 44 U/L 12    CTAP 02/11/2021:  FINDINGS:  Lower chest: No acute abnormality.   Hepatobiliary: No focal liver abnormality is seen. Status post   cholecystectomy. No biliary dilatation.   Pancreas: Unremarkable. No pancreatic ductal dilatation or  surrounding inflammatory changes.   Spleen: Normal in size without focal abnormality.   Adrenals/Urinary Tract: Adrenal glands are unremarkable. Kidneys are  normal, without renal calculi, focal lesion, or hydronephrosis.  Bladder is unremarkable.   Stomach/Bowel: Stomach is within normal limits. Appendix appears  normal. No evidence of bowel wall thickening, distention, or  inflammatory changes.   Vascular/Lymphatic: No significant vascular findings are present. No  enlarged abdominal or pelvic lymph nodes.   Reproductive: Status post hysterectomy. No adnexal masses.   Other: No abdominal wall hernia or abnormality. No abdominopelvic  ascites. No pneumoperitoneum.   Musculoskeletal: No acute or significant osseous findings.   IMPRESSION:  1. No acute intra-abdominal process.     Past Medical History:  Diagnosis Date   GERD (gastroesophageal reflux disease)    Hypertension    IBS (irritable bowel syndrome)    Past Surgical History:  Procedure Laterality Date   CHOLECYSTECTOMY     PARTIAL HYSTERECTOMY     ROTATOR CUFF REPAIR     Current Medications, Allergies, Past Medical History,  Past Surgical History, Family History and Social History were reviewed in [employer] record.  Review of Systems:   Constitutional: Negative for fever, sweats, chills or weight loss.  Respiratory: Negative for shortness of breath.   Cardiovascular: Negative for chest pain, palpitations and leg swelling.  Gastrointestinal: See HPI.  Musculoskeletal: Negative for back pain or muscle aches.  Neurological: Negative for dizziness, headaches or paresthesias.   Physical Exam: Ht 5\' 3"  (1.6 m)   Wt 157 lb (71.2 kg)   BMI 27.81 kg/m  BP 124/86   Pulse 75   Ht 5\' 3"  (1.6 m)   Wt 157 lb (71.2 kg)   BMI 27.81 kg/m   General: 61 year old female in no acute  distress. Head: Normocephalic and atraumatic. Eyes: No scleral icterus. Conjunctiva pink . Ears: Normal auditory acuity. Mouth: Dentition intact. No ulcers or lesions.  Lungs: Clear throughout to auscultation. Heart: Regular rate and rhythm, no murmur. Abdomen: Soft, nontender and nondistended. No masses or hepatomegaly. Normal bowel sounds x 4 quadrants.  Rectal: No externa hemorrhoids. Internal hemorrhoids without prolapse. No stool or mass in the rectal vault.  No evidence of perianal candidiasis.  CMA present during exam. Musculoskeletal: Symmetrical with no gross deformities. Extremities: No edema. Neurological: Alert oriented x 4. No focal deficits.  Psychological: Alert and cooperative. Normal mood and affect  Assessment and Recommendations:  83) 61 year old female with internal hemorrhoids, itchiness and burning discomfort without rectal bleeding  -Anusol suppository 25mg  one PR Q HS x 5 nights -Apply a small amount of Desitin inside the anal opening and to the external anal area tid as needed for anal or hemorrhoidal irritation/bleeding.  -Metamucil daily as tolerated.  MiraLAX nightly as needed to increase stool output. -Patient to contact office if anal rectal burning/itchiness persists or worsens  2) Colon cancer screening.  Normal colonoscopy 10/2019. -Next screening colonoscopy due 10/2029, earlier if symptoms warrant

## 2022-04-04 NOTE — Progress Notes (Signed)
____________________________________________________________  Attending physician addendum:  Thank you for sending this case to me. I have reviewed the entire note and agree with the plan.  If she is not improved with treatment as outlined, then have her follow-up with me next to see if hemorrhoidal banding may be needed.  Amada Jupiter, MD  ____________________________________________________________

## 2022-08-01 IMAGING — CT CT ABD-PELV W/ CM
2 of 5 series · 16 of 46 positions shown, 18 images · IV contrast (APPLIED)
Comparison: CT abdomen and pelvis dated February 11, 2021

CLINICAL DATA: Left lower quadrant abdominal pain

EXAM:
CT ABDOMEN AND PELVIS WITH CONTRAST
TECHNIQUE: Multidetector CT imaging of the abdomen and pelvis was performed
using the standard protocol following bolus administration of
intravenous contrast.

[Series 3: abdomen 5.0 · axial · 0.69mm/px · z∈[-1021,-641]mm · 13 of 88 slices shown, 15 images]
[im 6/88  soft-tissue]
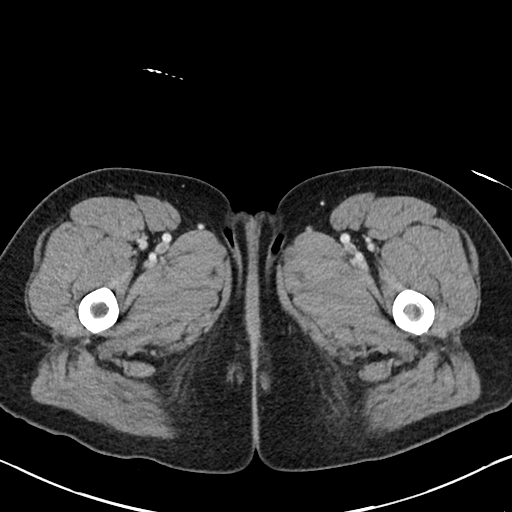
[im 6/88  bone]
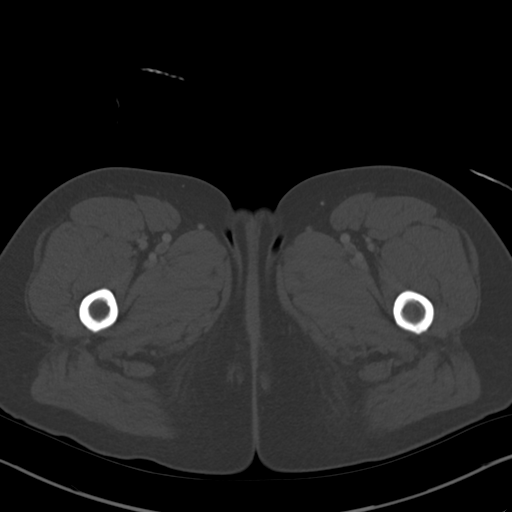
[im 11/88  soft-tissue]
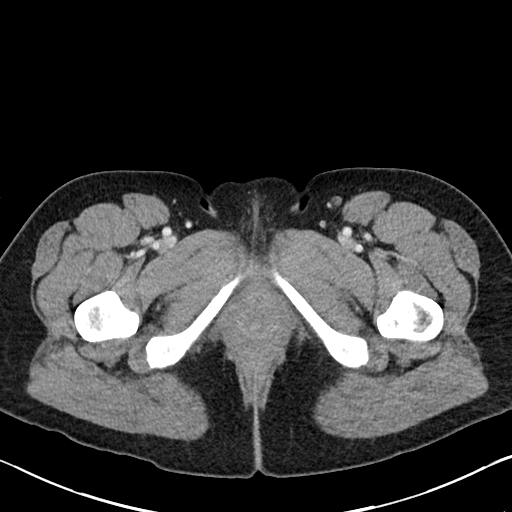
[im 17/88  soft-tissue]
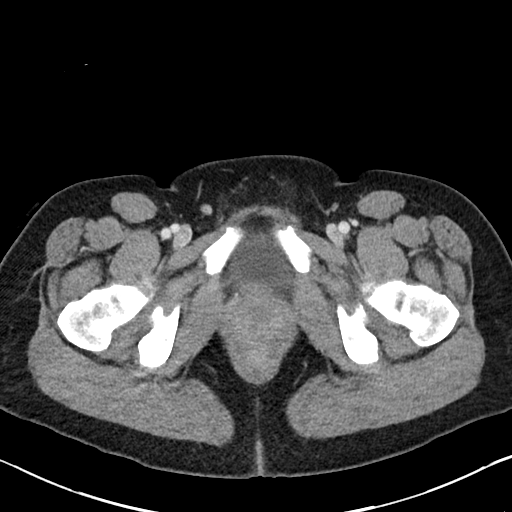
[im 28/88  soft-tissue]
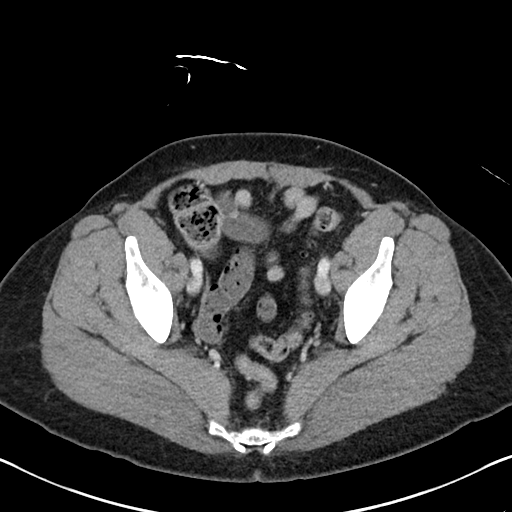
[im 33/88  soft-tissue]
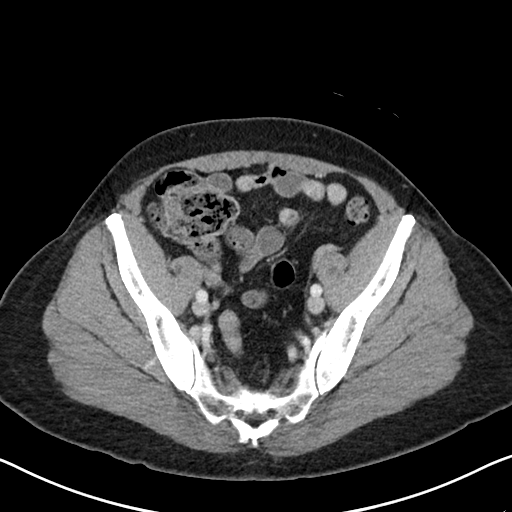
[im 39/88  soft-tissue]
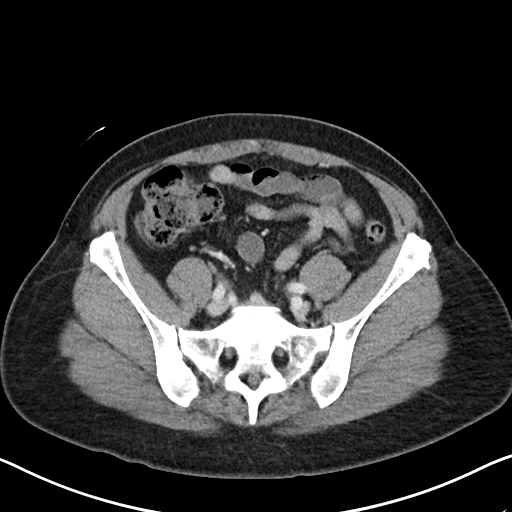
[im 44/88  soft-tissue]
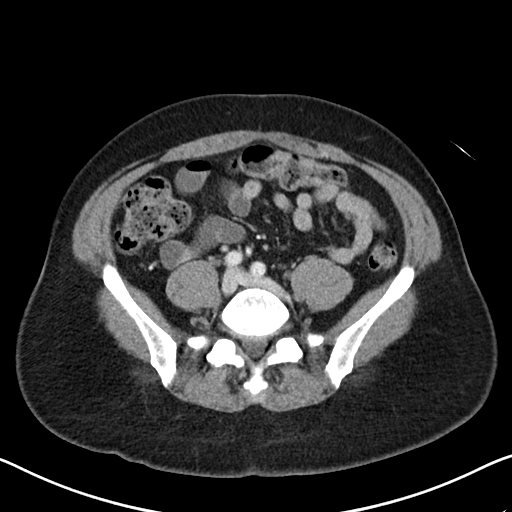
[im 49/88  soft-tissue]
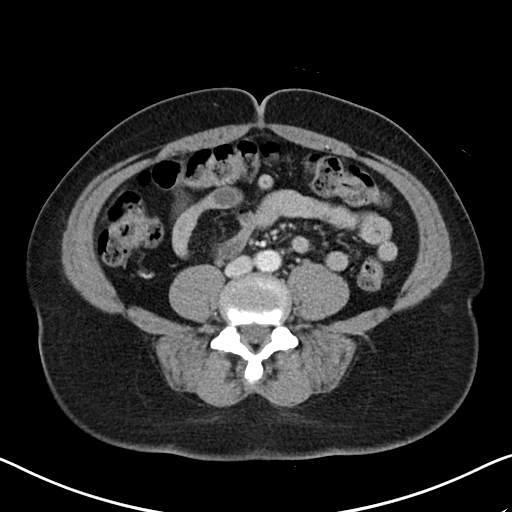
[im 55/88  soft-tissue]
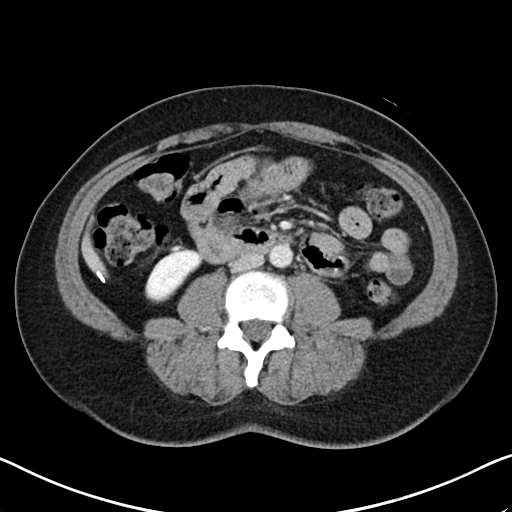
[im 55/88  bone]
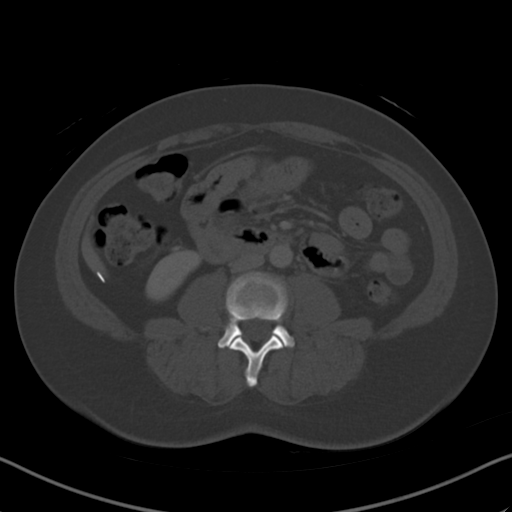
[im 60/88  soft-tissue]
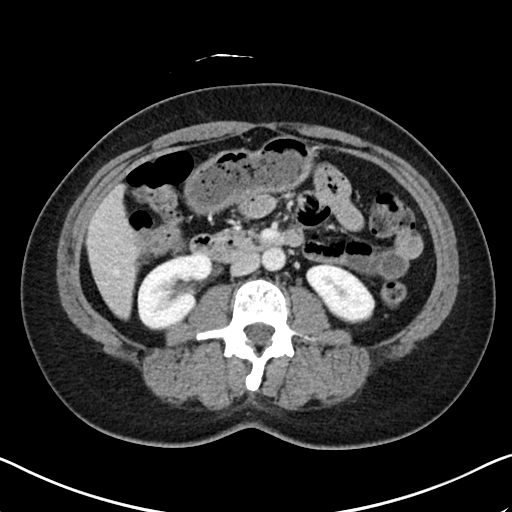
[im 71/88  soft-tissue]
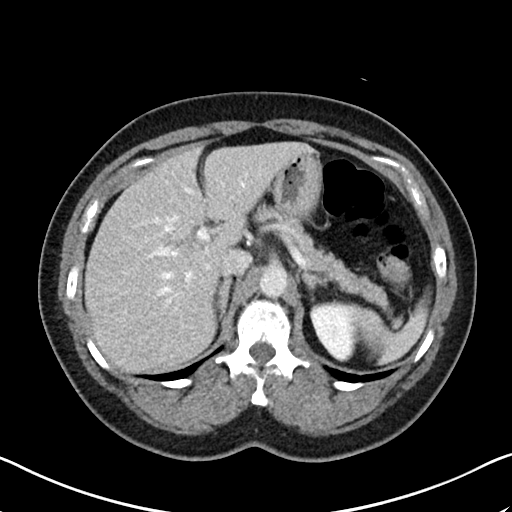
[im 77/88  soft-tissue]
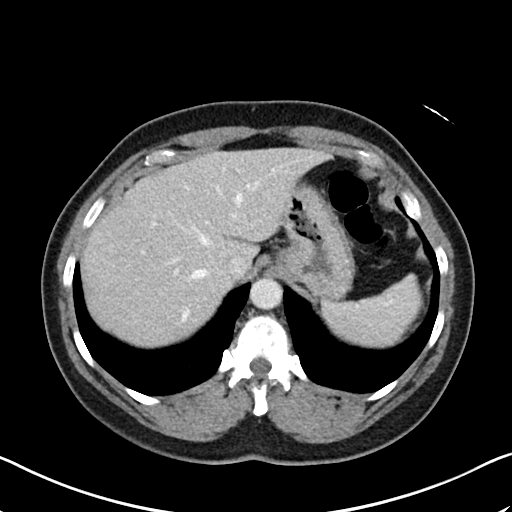
[im 82/88  soft-tissue]
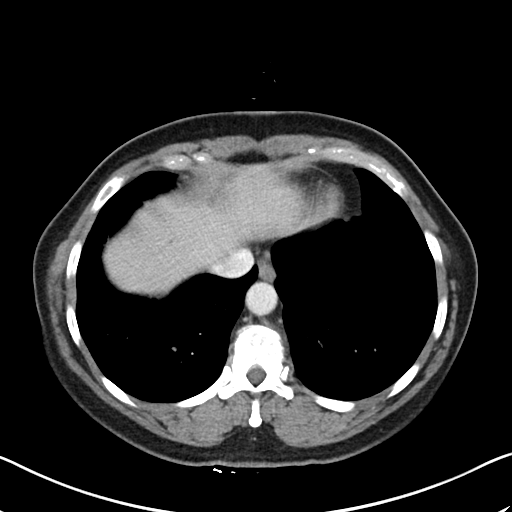

[Series 6: abdomen 3.0 mpr cor · coronal · 0.67mm/px · 3 of 80 slices shown]
[im 27/80  soft-tissue]
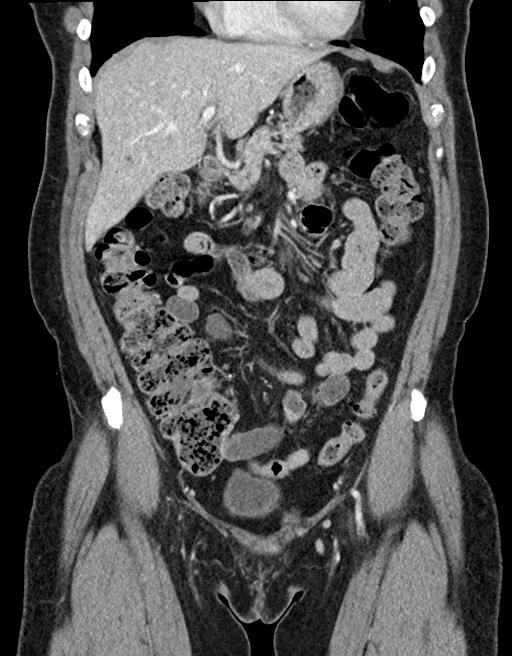
[im 36/80  soft-tissue]
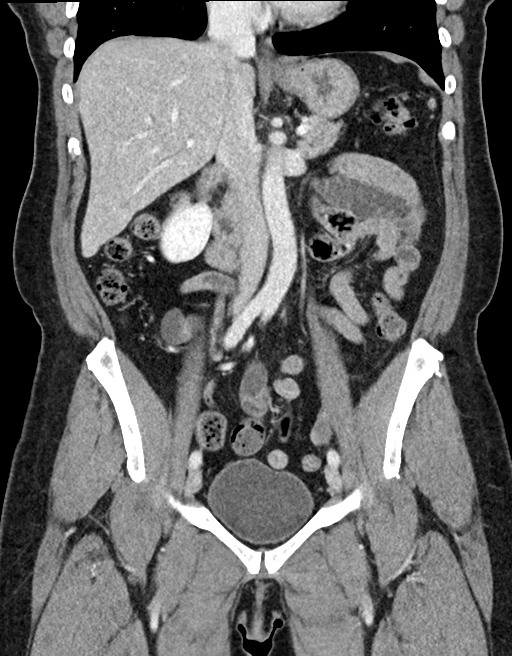
[im 44/80  soft-tissue]
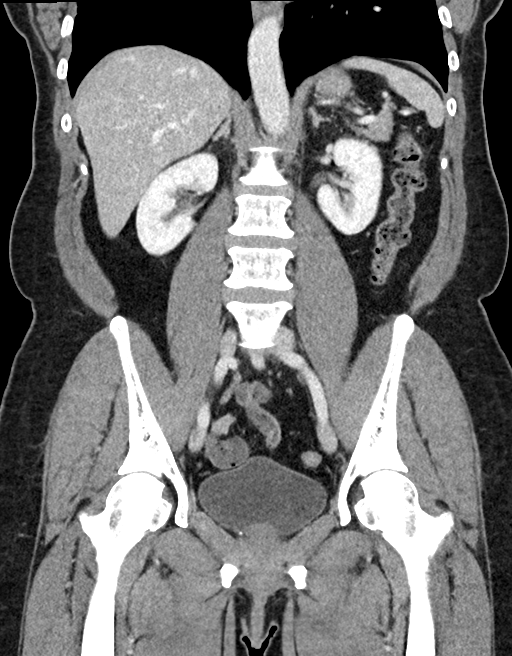

[16 of 46 positions shown; findings below may reference images not displayed]

RADIATION DOSE REDUCTION: This exam was performed according to the
departmental dose-optimization program which includes automated
exposure control, adjustment of the mA and/or kV according to
patient size and/or use of iterative reconstruction technique.

CONTRAST:  100mL OMNIPAQUE IOHEXOL 300 MG/ML  SOLN
FINDINGS: Lower chest: New small solid nodule of the right lower lobe
measuring 3 mm on series 5, image 14.

Hepatobiliary: No focal liver abnormality is seen. Status post
cholecystectomy. No biliary dilatation.

Pancreas: Unremarkable. No pancreatic ductal dilatation or
surrounding inflammatory changes.

Spleen: Normal in size without focal abnormality.

Adrenals/Urinary Tract: Bilateral adrenal glands are unremarkable.
No hydronephrosis or nephrolithiasis. Bladder is unremarkable.

Stomach/Bowel: Stomach is within normal limits. Appendix appears
normal. No evidence of bowel wall thickening, distention, or
inflammatory changes.

Vascular/Lymphatic: No significant vascular findings are present. No
enlarged abdominal or pelvic lymph nodes.

Reproductive: No adnexal masses.

Other: No abdominopelvic ascites or intraperitoneal free air.

Musculoskeletal: No acute or significant osseous findings.
IMPRESSION: 1. No acute findings in the abdomen or pelvis, including no evidence
of obstructive uropathy.
2. Small solid nodule of the right lower lobe measuring 3 mm. No
follow-up needed if patient is low-risk.This recommendation follows
the consensus statement: Guidelines for Management of Incidental
Pulmonary Nodules Detected on CT Images: From the [HOSPITAL]

## 2022-08-24 ENCOUNTER — Ambulatory Visit: Payer: BC Managed Care – PPO | Admitting: Gastroenterology

## 2022-08-24 ENCOUNTER — Encounter: Payer: Self-pay | Admitting: Gastroenterology

## 2022-08-24 VITALS — BP 114/76 | HR 75 | Ht 63.0 in | Wt 162.0 lb

## 2022-08-24 DIAGNOSIS — K5909 Other constipation: Secondary | ICD-10-CM

## 2022-08-24 DIAGNOSIS — R12 Heartburn: Secondary | ICD-10-CM | POA: Diagnosis not present

## 2022-08-24 DIAGNOSIS — R0789 Other chest pain: Secondary | ICD-10-CM | POA: Diagnosis not present

## 2022-08-24 NOTE — Patient Instructions (Signed)
_______________________________________________________  If you are age 63 or older, your body mass index should be between 23-30. Your Body mass index is 28.7 kg/m. If this is out of the aforementioned range listed, please consider follow up with your Primary Care Provider.  If you are age 64 or younger, your body mass index should be between 19-25. Your Body mass index is 28.7 kg/m. If this is out of the aformentioned range listed, please consider follow up with your Primary Care Provider.   ________________________________________________________  The [place] [facility] providers would like to encourage you to use [facility] to communicate with providers for non-urgent requests or questions.  Due to long hold times on the telephone, sending your provider a message by [facility] may be a faster and more efficient way to get a response.  Please allow 48 business hours for a response.  Please remember that this is for non-urgent requests.  _______________________________________________________  It was a pleasure to see you today!  Thank you for trusting me with your gastrointestinal care!     

## 2022-08-24 NOTE — Progress Notes (Signed)
[name]  Chief Complaint: Chest wall pain  Subjective  History: From my December 2022 office visit: "Chronic LUQ pain, nonspecific, negative extensive work-up with GYN as well as CT imaging and EGD colonoscopy in [place]. It does sound likely related constipation.  I reassured her and have no further testing planned at present. Longstanding reflux, she says [name].  Sounds like she has some globus related to it but not likely stricture requiring endoscopy at this time"  ED visits for exacerbation of his pain June and September 2023.  On the latter visit, negative lab and imaging workup including CBC, CMP and CTAP.  Scan with IV contrast, nonspecific description of possible prepyloric gastric wall thickening.  From August 2023 clinic visit here with our APP: "Valerie Nunez is a 62 year old female with a past medical history of hypertension, GERD and chronic constipation.  S/P cholecystectomy 05/2013.  She is followed by Dr. Loletha Carrow.  She presents today with complaints of hemorrhoidal burning pain and itchiness which started 2 weeks ago.  She used Preparation H which worsened her anorectal pain.  She denies any rectal bleeding.  She has intermittent constipation for which she takes Metamucil as needed, not on a daily basis.  At times, she does not feel emptied after passing a BM.  She underwent a colonoscopy at [facility] 10/20/2019 which resulted in a good bowel prep and the colonoscopy was normal.  A repeat colonoscopy in 10 years was recommended.  An EGD was done on the same date which showed erythema at the GE junction consistent with reflux.  A repeat EGD 06/28/2020 showed a hiatal hernia and a moderate amount of retained food matter suggestive of gastroparesis.  No GERD symptoms or upper abdominal pain at this time. " ________________  Valerie Nunez was giving somewhat tangential history today.  Essentially, she is most bothered by a left-sided  chest wall pain that is sharp and under the left breast.  It has occurred intermittently for many months now.  It is not exacerbated by movements, eating or respiration.  She had no trauma to that area.  She has been putting ice and sometimes heat on it with some relief.  She went to primary care and they suggested she revisit her reflux issues with [place].  She was also talking about conflicting dietary recommendations regarding fiber and treatment of her constipation, and also seem to feel that it previous endoscopist or other provider suggested she might need surgery for reflux. She denies nausea, vomiting, early satiety or weight loss. ROS: Cardiovascular: Chest wall pain as noted above.  Not exertional. Respiratory: no dyspnea  The patient's Past Medical, Family and Social History were reviewed and are on file in the EMR.  Objective:  Med list reviewed  Current Outpatient Medications:    amLODipine (NORVASC) 5 MG tablet, Take 5 mg by mouth daily., Disp: , Rfl:    dexlansoprazole (DEXILANT) 60 MG capsule, Dexilant 60 mg capsule, delayed release, Disp: , Rfl:    fexofenadine (ALLEGRA) 180 MG tablet, Take 180 mg by mouth daily as needed for allergies or rhinitis., Disp: , Rfl:    fluticasone furoate-vilanterol (BREO ELLIPTA) 200-25 MCG/ACT AEPB, Inhale 1 puff into the lungs daily., Disp: , Rfl:    potassium chloride 20 MEQ/15ML (10%) SOLN, Take 15 mLs by mouth daily., Disp: , Rfl:    albuterol (PROVENTIL HFA) 108 (90 Base) MCG/ACT inhaler, Inhale 1-2 puffs into the lungs every 6 (six)  hours as needed for wheezing or shortness of breath. (Patient not taking: Reported on 08/24/2022), Disp: , Rfl:    dicyclomine (BENTYL) 10 MG capsule, TAKE 1 CAPSULE (10 MG TOTAL) BY MOUTH 2 (TWO) TIMES DAILY AS NEEDED FOR SPASMS. (Patient not taking: Reported on 04/02/2022), Disp: 180 capsule, Rfl: 1   hydrocortisone (ANUSOL-HC) 25 MG suppository, Place 1 suppository (25 mg total) rectally at bedtime. (Patient not  taking: Reported on 08/24/2022), Disp: 5 suppository, Rfl: 0   hydrOXYzine (ATARAX) 25 MG tablet, Take 25 mg by mouth every 6 (six) hours as needed. (Patient not taking: Reported on 04/02/2022), Disp: , Rfl:    ondansetron (ZOFRAN-ODT) 4 MG disintegrating tablet, Take 1 tablet (4 mg total) by mouth every 8 (eight) hours as needed for nausea or vomiting. (Patient not taking: Reported on 04/02/2022), Disp: 20 tablet, Rfl: 0   Prucalopride Succinate (MOTEGRITY) 1 MG TABS, Take 1 tablet by mouth daily. (Patient not taking: Reported on 04/02/2022), Disp: , Rfl:    Vital signs in last 24 hrs: Vitals:   08/24/22 1510  BP: 114/76  Pulse: 75   Wt Readings from Last 3 Encounters:  08/24/22 162 lb (73.5 kg)  04/02/22 157 lb (71.2 kg)  08/01/21 158 lb 6 oz (71.8 kg)    Physical Exam  Well-appearing HEENT: sclera anicteric, oral mucosa moist without lesions Neck: supple, no thyromegaly, JVD or lymphadenopathy Cardiac: Regular without murmur.,  no peripheral edema mild tenderness left anterior chest wall at the base of the left breast.  No skin changes or other visible abnormality or palpable lesions there. Pulm: clear to auscultation bilaterally, normal RR and effort noted Abdomen: soft, no tenderness, with active bowel sounds. No guarding or palpable hepatosplenomegaly.   Labs:   ___________________________________________ Radiologic studies:   ____________________________________________ Other:   _____________________________________________ Assessment & Plan  Assessment: Encounter Diagnoses  Name Primary?   Chest wall pain Yes   Heartburn    Chronic constipation   Chronic constipation, recommended it is okay to resume a high-fiber diet and take MiraLAX as needed.  She would prefer to cut down the number prescription medicine she is taking.  While she reportedly has had intermittent heartburn prior to seeing [place], it is not clear if this was the original indication for her to be on PPI  therapy.  Again, it was difficult to get a clear history about the timing indication for that medication.  She reportedly had mild endoscopic findings of reflux on November 2021 EGD in [place].  I recommend she decrease the Dexilant to every other day and, if no change in heartburn frequency or severity, cut back further to twice a week with the goal of trying to get off it altogether.  She could then use as needed H2 blocker for heartburn.  Diet and lifestyle reflux measures for reflux also reviewed.  Her main symptom today is clearly chest wall pain and it is not digestive in nature.  I recommended she revisit this with primary care, ensure she is up-to-date on breast exam and mammography and discuss local treatment such as heat, topical NSAIDs or lidocaine or what ever they would feel appropriate.  Return to our office as needed.  20 minutes were spent on this encounter (including chart review, history/exam, counseling/coordination of care, and documentation) > 50% of that time was spent on counseling and coordination of care.   Nelida Meuse III

## 2022-09-12 ENCOUNTER — Encounter: Payer: Self-pay | Admitting: Obstetrics and Gynecology

## 2022-09-12 ENCOUNTER — Ambulatory Visit (INDEPENDENT_AMBULATORY_CARE_PROVIDER_SITE_OTHER): Payer: BC Managed Care – PPO | Admitting: Obstetrics and Gynecology

## 2022-09-12 VITALS — BP 131/86 | HR 84 | Ht 63.75 in | Wt 158.0 lb

## 2022-09-12 DIAGNOSIS — M62838 Other muscle spasm: Secondary | ICD-10-CM | POA: Diagnosis not present

## 2022-09-12 DIAGNOSIS — R350 Frequency of micturition: Secondary | ICD-10-CM | POA: Diagnosis not present

## 2022-09-12 LAB — POCT URINALYSIS DIPSTICK
Bilirubin, UA: NEGATIVE
Blood, UA: NEGATIVE
Glucose, UA: NEGATIVE
Ketones, UA: NEGATIVE
Leukocytes, UA: NEGATIVE
Nitrite, UA: NEGATIVE
Protein, UA: NEGATIVE
Spec Grav, UA: 1.02 (ref 1.010–1.025)
Urobilinogen, UA: 0.2 E.U./dL
pH, UA: 6 (ref 5.0–8.0)

## 2022-09-12 MED ORDER — DIAZEPAM 5 MG PO TABS: ORAL_TABLET | ORAL | 0 refills | Status: AC

## 2022-09-12 MED ORDER — ESTRADIOL 0.1 MG/GM VA CREA: TOPICAL_CREAM | VAGINAL | 11 refills | Status: DC

## 2022-09-12 NOTE — Progress Notes (Unsigned)
[facility] New Patient Evaluation and Consultation  Referring Provider: McAlexander, Magdalene River,* PCP: Cornelious Bryant, FNP Date of Service: 09/12/2022  SUBJECTIVE Chief Complaint: New Patient (Initial Visit)  History of Present Illness: Valerie Nunez is a 62 y.o. {ED SANE [phone] female seen in consultation at the request of Dr. Clista Bernhardt for evaluation of ***.    ***Review of records significant for: ***  Urinary Symptoms: Does not leak urine.   Day time voids- every few hours.  Nocturia: 4 times per night to void. Voiding dysfunction: she empties her bladder well.  does not use a catheter to empty bladder.  When urinating, she feels the need to urinate multiple times in a row Drinks: water, juice, occasional soda  She has been prescribed Myrbetriq but does not take it regularly. Usually only has urgency is she drinks soda  UTIs:  0  UTI's in the last year.   Denies history of blood in urine and kidney or bladder stones  Pelvic Organ Prolapse Symptoms:                  She Admits to a feeling of a bulge the vaginal area. It has been present for 1 years.  Admits to seeing a bulge.  This bulge is bothersome. Has a pain and pressure sensation.   Bowel Symptom: Bowel movements: 1 time(s) per day Stool consistency: hard or soft  Straining: yes, sometimes Splinting: no.  Incomplete evacuation: yes.  She Denies accidental bowel leakage / fecal incontinence Bowel regimen: dietary fiber, stool softener, and miralax   Sexual Function Sexually active: yes.  Pain with sex: Yes, deep in the pelvis, has discomfort due to prolapse, has discomfort due to dryness  Pelvic Pain Admits to pelvic pain   Past Medical History:  Past Medical History:  Diagnosis Date   GERD (gastroesophageal reflux disease)    Hypertension    IBS (irritable bowel syndrome)      Past Surgical History:   Past Surgical History:  Procedure Laterality Date   ABDOMINAL  HYSTERECTOMY     CHOLECYSTECTOMY     ROTATOR CUFF REPAIR       Past OB/GYN History: OB History  Gravida Para Term Preterm AB Living  1         1  SAB IAB Ectopic Multiple Live Births          1    # Outcome Date GA Lbr Len/2nd Weight Sex Delivery Anes PTL Lv  1 Gravida             Vaginal deliveries: 1,  Forceps/ Vacuum deliveries: 0, Cesarean section: 0 S/p hysterectomy   Medications: She has a current medication list which includes the following prescription(s): diazepam, esomeprazole, [START ON 09/13/2022] estradiol, fluticasone furoate-vilanterol, potassium chloride, pravastatin, albuterol, amlodipine, dicyclomine, fexofenadine, hydrocortisone, hydroxyzine, ondansetron, and motegrity.   Allergies: Patient is allergic to naproxen, cetirizine, montelukast sodium, and hydrocodone.   Social History:  Social History   Tobacco Use   Smoking status: Never   Smokeless tobacco: Never  Vaping Use   Vaping Use: Never used  Substance Use Topics   Alcohol use: Never   Drug use: Never    Relationship status: {relationship status:24764} She lives with ***.   She {ACTION; IS/IS NIO:27035009} employed ***. Regular exercise: {Yes/No:304960894} History of abuse: {Yes/No:304960894}  Family History:   Family History  Problem Relation Age of Onset   Hypertension Mother    Heart disease Father    Hypertension Father  Esophageal cancer Brother    Colon cancer Neg Hx    Pancreatic cancer Neg Hx    Stomach cancer Neg Hx      Review of Systems: ROS   OBJECTIVE Physical Exam: Vitals:   09/12/22 1433  BP: 131/86  Pulse: 84  Weight: 158 lb (71.7 kg)  Height: 5' 3.75" (1.619 m)    Physical Exam Constitutional:      General: She is not in acute distress. Pulmonary:     Effort: Pulmonary effort is normal.  Abdominal:     General: There is no distension.     Palpations: Abdomen is soft.     Tenderness: There is no abdominal tenderness. There is no rebound.   Musculoskeletal:        General: No swelling. Normal range of motion.  Skin:    General: Skin is warm and dry.     Findings: No rash.  Neurological:     Mental Status: She is alert and oriented to person, place, and time.  Psychiatric:        Mood and Affect: Mood normal.        Behavior: Behavior normal.      GU / Detailed Urogynecologic Evaluation:  Pelvic Exam: Normal external female genitalia; Bartholin's and Skene's glands normal in appearance; urethral meatus normal in appearance, no urethral masses or discharge.   CST: negative  s/p hysterectomy: Speculum exam reveals normal vaginal mucosa with  atrophy and normal vaginal cuff.  Adnexa no mass, fullness, tenderness.    Pelvic floor strength II/V  Pelvic floor musculature: Very high tone throughout, difficult speculum insertion. Right levator tender, Right obturator tender, Left levator tender, Left obturator tender  POP-Q:   POP-Q  -3                                            Aa   -3                                           Ba  -8                                              C   2                                            Gh  2                                            Pb  8                                            tvl   -3  Ap  -3                                            Bp                                                 D      Rectal Exam:  Normal external rectum  Post-Void Residual (PVR) by Bladder Scan: In order to evaluate bladder emptying, we discussed obtaining a postvoid residual and she agreed to this procedure.  Procedure: The ultrasound unit was placed on the patient's abdomen in the suprapubic region after the patient had voided. A PVR of 0 ml was obtained by bladder scan.  Laboratory Results: POC urine: negative   ASSESSMENT AND PLAN [name] is a 62 y.o. with:  1. Urinary frequency   2. Levator spasm     - [facility]  PT Vaginal valium 3 month follow up  Marguerita Beards, MD   Medical Decision Making:  - Reviewed/ ordered a clinical laboratory test - Reviewed/ ordered a radiologic study - Reviewed/ ordered medicine test - Decision to obtain old records - Discussion of management of or test interpretation with an external physician / other healthcare professional  - Assessment requiring independent historian - Review and summation of prior records - Independent review of image, tracing or specimen

## 2022-09-12 NOTE — Patient Instructions (Addendum)
Today we talked about ways to manage bladder urgency such as altering your diet to avoid irritative beverages and foods (bladder diet) as well as attempting to decrease stress and other exacerbating factors.   The Most Bothersome Foods* The Least Bothersome Foods*  Coffee - Regular & Decaf Tea - caffeinated Carbonated beverages - cola, non-colas, diet & caffeine-free Alcohols - Beer, Red Wine, White Wine, Champagne Fruits - Grapefruit, [place], Orange, [employer] - Cranberry, Grapefruit, Orange, Pineapple Vegetables - Tomato & Tomato Products Flavor Enhancers - Hot peppers, Spicy foods, Chili, Horseradish, Vinegar, Monosodium glutamate (MSG) Artificial Sweeteners - NutraSweet, Sweet 'N Low, Equal (sweetener), Saccharin Ethnic foods - [place], [place], [place] food Express Scripts - low-fat & whole Fruits - Bananas, Blueberries, Honeydew melon, Pears, Raisins, Watermelon Vegetables - Broccoli, Brussels Sprouts, [place], Carrots, Cauliflower, [place], Cucumber, Mushrooms, Peas, Radishes, Squash, Zucchini, White potatoes, Sweet potatoes & yams Poultry - Chicken, Eggs, [place], [employer] - Beef, Programmer, multimedia, Lamb Seafood - Shrimp, [place] fish, Salmon Grains - Oat, Rice Snacks - Pretzels, Popcorn  *Lissa Morales et al. Diet and its role in interstitial cystitis/bladder pain syndrome (IC/BPS) and comorbid conditions. [place] 2012 Jan 11.    Use estrace cream 0.5g nightly for two weeks then twice a week after.   Vulvovaginal moisturizer Options: Vitamin E oil (pump or capsule) or cream (Gene's Vit E Cream) Coconut oil Silicone-based lubricant for use during intercourse ("wet platinum" is a brand available at most drugstores) Crisco V-magic (on [place]) Consider the ingredients of the product - the fewer the ingredients the better!  Directions for Use: Clean and dry your hands Gently dab the vulvar/vaginal area dry as needed Apply a "pea-sized" amount of the moisturizer onto your  fingertip Using you other hand, open the labia  Apply the moisturizer to the vulvar/vaginal tissues Wear loose fitting underwear/clothing if possible following application Use moisturize up to 3 times daily as desired.   I will prescribe valium 5 mg pills to place vaginally up to 2 times a day for vaginal muscle spasms. Start at night and take one every night for the next 2 weeks to see if it improves your symptoms. Once you are improving you can taper off the medication and just use as needed. If the medication makes you drowsy then only use at bedtime and/or we can reduce the dose. Do not use gel to place it, as that will prevent the tablet from dissolving.  Instead place 1-2 drops of water on the table before inserting it in the vagina. If the pills do not dissolve well we can switch to a special compounded suppository. Let me know how you are doing on the medication and if you have any questions.

## 2022-09-13 ENCOUNTER — Encounter: Payer: Self-pay | Admitting: Obstetrics and Gynecology

## 2022-10-08 ENCOUNTER — Encounter: Payer: Self-pay | Admitting: *Deleted

## 2022-11-03 ENCOUNTER — Other Ambulatory Visit: Payer: Self-pay | Admitting: Obstetrics and Gynecology

## 2022-12-11 ENCOUNTER — Ambulatory Visit (INDEPENDENT_AMBULATORY_CARE_PROVIDER_SITE_OTHER): Payer: Medicare Other | Admitting: Obstetrics and Gynecology

## 2022-12-11 ENCOUNTER — Encounter: Payer: Self-pay | Admitting: Obstetrics and Gynecology

## 2022-12-11 VITALS — BP 130/82 | HR 78

## 2022-12-11 DIAGNOSIS — M62838 Other muscle spasm: Secondary | ICD-10-CM

## 2022-12-11 DIAGNOSIS — R350 Frequency of micturition: Secondary | ICD-10-CM

## 2022-12-11 NOTE — Patient Instructions (Signed)
I will look into a few placed for pelvic PT and I'll call you and send a referral.   For the estrogen cream mix it with Vaseline to decrease burning.

## 2022-12-11 NOTE — Progress Notes (Signed)
[facility] Return Visit  SUBJECTIVE  History of Present Illness: Valerie Nunez is a 62 y.o. female seen in follow-up for levator spasm. Plan at last visit was start pelvic floor PT and take valium as needed.   She is also using vaginal estrogen cream.    She reports she tried to schedule pelvic floor PT and they told her they do no accept insurance and it would be 100$ per visit.    Past Medical History: Patient  has a past medical history of GERD (gastroesophageal reflux disease), Hypertension, and IBS (irritable bowel syndrome).   Past Surgical History: She  has a past surgical history that includes Cholecystectomy; Rotator cuff repair; and Abdominal hysterectomy.   Medications: She has a current medication list which includes the following prescription(s): albuterol, amlodipine, diazepam, dicyclomine, esomeprazole, estradiol, fexofenadine, fluticasone furoate-vilanterol, hydrocortisone, hydroxyzine, ondansetron, potassium chloride, pravastatin, and motegrity.   Allergies: Patient is allergic to naproxen, cetirizine, montelukast sodium, and hydrocodone.   Social History: Patient  reports that she has never smoked. She has never used smokeless tobacco. She reports that she does not drink alcohol and does not use drugs.      OBJECTIVE     Physical Exam: Vitals:   12/11/22 1523 12/11/22 1524  BP: (!) 137/90 130/82  Pulse: (!) 104 78   Gen: No apparent distress, A&O x 3.  Detailed Urogynecologic Evaluation:  Deferred.    ASSESSMENT AND PLAN    Ms. Fletchall is a 62 y.o. with:  1. Levator spasm   2. Urinary frequency    Patient has been unable to start pelvic PT due to cost. Will send a new referral to [facility] who offers pelvic floor PT and verified they do accept patient insurance. She has been using the vaginal valium as needed and reports it has helped some.  Reports it is only bad when she drinks soda. Discussed not drinking soda and she reports she  has stopped the Myrbetriq. Encouraged her to let us know if her urgency/frequency increases and she needs medication involvement.   Patient to follow up in 6 months or sooner if needed.

## 2022-12-26 ENCOUNTER — Telehealth: Payer: Self-pay | Admitting: Gastroenterology

## 2022-12-26 NOTE — Telephone Encounter (Signed)
Patient called stating she has issues  with swallowing, requested to speak with a nurse.

## 2023-01-08 ENCOUNTER — Telehealth: Payer: Self-pay | Admitting: Obstetrics and Gynecology

## 2023-01-08 NOTE — Telephone Encounter (Signed)
Just fyi- [phone] [facility] has attempted to call this patient to schedule several times, but have been unsuccessful in reaching her.  They wanted to pass that along to [place].  They have exhausted the number of calls they can make to her at this time.

## 2023-03-07 ENCOUNTER — Ambulatory Visit: Payer: Medicare Other | Admitting: Physician Assistant

## 2023-06-13 ENCOUNTER — Ambulatory Visit: Payer: Medicare Other | Admitting: Obstetrics and Gynecology
